# Patient Record
Sex: Male | Born: 1965 | Race: White | Hispanic: No | Marital: Married | Smoking: Current every day smoker
Health system: Southern US, Community
[De-identification: ages and names within clinical notes are randomized; demographics above are authoritative.]

## PROBLEM LIST (undated history)

## (undated) DIAGNOSIS — I2510 Atherosclerotic heart disease of native coronary artery without angina pectoris: Secondary | ICD-10-CM

## (undated) DIAGNOSIS — Z87891 Personal history of nicotine dependence: Secondary | ICD-10-CM

## (undated) HISTORY — DX: Atherosclerotic heart disease of native coronary artery without angina pectoris: I25.10

## (undated) HISTORY — PX: TOE SURGERY: SHX1073

## (undated) HISTORY — PX: HAND SURGERY: SHX662

## (undated) HISTORY — DX: Personal history of nicotine dependence: Z87.891

---

## 1999-06-08 ENCOUNTER — Encounter: Payer: Self-pay | Admitting: Specialist

## 1999-06-13 ENCOUNTER — Ambulatory Visit (HOSPITAL_COMMUNITY): Admission: RE | Admit: 1999-06-13 | Discharge: 1999-06-13 | Payer: Self-pay | Admitting: Specialist

## 2004-01-18 ENCOUNTER — Emergency Department (HOSPITAL_COMMUNITY): Admission: EM | Admit: 2004-01-18 | Discharge: 2004-01-18 | Payer: Self-pay | Admitting: Emergency Medicine

## 2007-05-25 ENCOUNTER — Ambulatory Visit (HOSPITAL_COMMUNITY): Payer: Self-pay | Admitting: Psychiatry

## 2007-06-17 ENCOUNTER — Ambulatory Visit (HOSPITAL_COMMUNITY): Payer: Self-pay | Admitting: Psychiatry

## 2007-08-27 ENCOUNTER — Ambulatory Visit (HOSPITAL_COMMUNITY): Payer: Self-pay | Admitting: Psychiatry

## 2007-11-18 ENCOUNTER — Ambulatory Visit (HOSPITAL_COMMUNITY): Payer: Self-pay | Admitting: Psychiatry

## 2008-01-29 ENCOUNTER — Ambulatory Visit (HOSPITAL_COMMUNITY): Payer: Self-pay | Admitting: Psychiatry

## 2008-04-19 ENCOUNTER — Ambulatory Visit (HOSPITAL_COMMUNITY): Payer: Self-pay | Admitting: Psychiatry

## 2008-07-19 ENCOUNTER — Ambulatory Visit (HOSPITAL_COMMUNITY): Payer: Self-pay | Admitting: Psychiatry

## 2008-08-31 ENCOUNTER — Ambulatory Visit (HOSPITAL_COMMUNITY): Payer: Self-pay | Admitting: Psychiatry

## 2008-10-25 ENCOUNTER — Ambulatory Visit (HOSPITAL_COMMUNITY): Payer: Self-pay | Admitting: Psychiatry

## 2009-02-10 ENCOUNTER — Ambulatory Visit (HOSPITAL_COMMUNITY): Payer: Self-pay | Admitting: Psychiatry

## 2009-05-05 ENCOUNTER — Ambulatory Visit (HOSPITAL_COMMUNITY): Payer: Self-pay | Admitting: Psychiatry

## 2009-06-28 ENCOUNTER — Ambulatory Visit (HOSPITAL_COMMUNITY): Payer: Self-pay | Admitting: Psychiatry

## 2016-10-10 ENCOUNTER — Other Ambulatory Visit: Payer: Self-pay

## 2016-10-10 ENCOUNTER — Encounter (HOSPITAL_COMMUNITY)
Admission: EM | Disposition: A | Discharge status: Home / Self Care | Payer: Self-pay | Source: Home / Self Care | Attending: Internal Medicine

## 2016-10-10 ENCOUNTER — Ambulatory Visit (HOSPITAL_COMMUNITY): Admit: 2016-10-10 | Payer: Self-pay | Admitting: Cardiology

## 2016-10-10 ENCOUNTER — Inpatient Hospital Stay (HOSPITAL_COMMUNITY): Payer: BLUE CROSS/BLUE SHIELD

## 2016-10-10 ENCOUNTER — Encounter (HOSPITAL_COMMUNITY): Payer: Self-pay | Admitting: Emergency Medicine

## 2016-10-10 ENCOUNTER — Inpatient Hospital Stay (HOSPITAL_COMMUNITY)
Admission: EM | Admit: 2016-10-10 | Discharge: 2016-10-13 | DRG: 247 | Disposition: A | Discharge status: Home / Self Care | Payer: BLUE CROSS/BLUE SHIELD | Attending: Internal Medicine | Admitting: Internal Medicine

## 2016-10-10 DIAGNOSIS — Z9861 Coronary angioplasty status: Secondary | ICD-10-CM

## 2016-10-10 DIAGNOSIS — I25119 Atherosclerotic heart disease of native coronary artery with unspecified angina pectoris: Secondary | ICD-10-CM | POA: Diagnosis present

## 2016-10-10 DIAGNOSIS — E785 Hyperlipidemia, unspecified: Secondary | ICD-10-CM | POA: Diagnosis not present

## 2016-10-10 DIAGNOSIS — I2510 Atherosclerotic heart disease of native coronary artery without angina pectoris: Secondary | ICD-10-CM

## 2016-10-10 DIAGNOSIS — D72829 Elevated white blood cell count, unspecified: Secondary | ICD-10-CM | POA: Diagnosis present

## 2016-10-10 DIAGNOSIS — N179 Acute kidney failure, unspecified: Secondary | ICD-10-CM | POA: Diagnosis present

## 2016-10-10 DIAGNOSIS — Z955 Presence of coronary angioplasty implant and graft: Secondary | ICD-10-CM

## 2016-10-10 DIAGNOSIS — I213 ST elevation (STEMI) myocardial infarction of unspecified site: Secondary | ICD-10-CM

## 2016-10-10 DIAGNOSIS — E7800 Pure hypercholesterolemia, unspecified: Secondary | ICD-10-CM | POA: Diagnosis present

## 2016-10-10 DIAGNOSIS — Z87891 Personal history of nicotine dependence: Secondary | ICD-10-CM

## 2016-10-10 DIAGNOSIS — I255 Ischemic cardiomyopathy: Secondary | ICD-10-CM | POA: Diagnosis present

## 2016-10-10 DIAGNOSIS — I2102 ST elevation (STEMI) myocardial infarction involving left anterior descending coronary artery: Principal | ICD-10-CM | POA: Diagnosis present

## 2016-10-10 DIAGNOSIS — R079 Chest pain, unspecified: Secondary | ICD-10-CM | POA: Diagnosis present

## 2016-10-10 DIAGNOSIS — F17210 Nicotine dependence, cigarettes, uncomplicated: Secondary | ICD-10-CM | POA: Diagnosis present

## 2016-10-10 HISTORY — PX: LEFT HEART CATH AND CORONARY ANGIOGRAPHY: CATH118249

## 2016-10-10 HISTORY — PX: CORONARY/GRAFT ACUTE MI REVASCULARIZATION: CATH118305

## 2016-10-10 LAB — COMPREHENSIVE METABOLIC PANEL
ALBUMIN: 4.3 g/dL (ref 3.5–5.0)
ALT: 28 U/L (ref 17–63)
ANION GAP: 12 (ref 5–15)
AST: 33 U/L (ref 15–41)
Alkaline Phosphatase: 87 U/L (ref 38–126)
BUN: 15 mg/dL (ref 6–20)
CALCIUM: 9.3 mg/dL (ref 8.9–10.3)
CO2: 27 mmol/L (ref 22–32)
Chloride: 98 mmol/L — ABNORMAL LOW (ref 101–111)
Creatinine, Ser: 1.34 mg/dL — ABNORMAL HIGH (ref 0.61–1.24)
GFR calc non Af Amer: 59 mL/min — ABNORMAL LOW (ref >60)
GLUCOSE: 141 mg/dL — AB (ref 65–99)
POTASSIUM: 3.6 mmol/L (ref 3.5–5.1)
SODIUM: 137 mmol/L (ref 135–145)
Total Bilirubin: 0.6 mg/dL (ref 0.3–1.2)
Total Protein: 7 g/dL (ref 6.5–8.1)

## 2016-10-10 LAB — DIFFERENTIAL
BASOS ABS: 0.1 10*3/uL (ref 0.0–0.1)
BASOS PCT: 1 %
Eosinophils Absolute: 0.4 10*3/uL (ref 0.0–0.7)
Eosinophils Relative: 3 %
LYMPHS PCT: 23 %
Lymphs Abs: 2.6 10*3/uL (ref 0.7–4.0)
Monocytes Absolute: 0.9 10*3/uL (ref 0.1–1.0)
Monocytes Relative: 8 %
NEUTROS ABS: 7.1 10*3/uL (ref 1.7–7.7)
NEUTROS PCT: 65 %

## 2016-10-10 LAB — PROTIME-INR
INR: 0.96
PROTHROMBIN TIME: 12.8 s (ref 11.4–15.2)

## 2016-10-10 LAB — APTT: APTT: 30 s (ref 24–36)

## 2016-10-10 LAB — TROPONIN I

## 2016-10-10 LAB — CBC
HCT: 46 % (ref 39.0–52.0)
Hemoglobin: 16.2 g/dL (ref 13.0–17.0)
MCH: 30.7 pg (ref 26.0–34.0)
MCHC: 35.2 g/dL (ref 30.0–36.0)
MCV: 87.1 fL (ref 78.0–100.0)
PLATELETS: 262 10*3/uL (ref 150–400)
RBC: 5.28 MIL/uL (ref 4.22–5.81)
RDW: 13.3 % (ref 11.5–15.5)
WBC: 10.9 10*3/uL — AB (ref 4.0–10.5)

## 2016-10-10 LAB — LIPID PANEL
CHOLESTEROL: 187 mg/dL (ref 0–200)
HDL: 37 mg/dL — AB (ref >40)
LDL Cholesterol: 143 mg/dL — ABNORMAL HIGH (ref 0–99)
Total CHOL/HDL Ratio: 5.1 RATIO
Triglycerides: 36 mg/dL (ref <150)
VLDL: 7 mg/dL (ref 0–40)

## 2016-10-10 LAB — BRAIN NATRIURETIC PEPTIDE: B Natriuretic Peptide: 17.8 pg/mL (ref 0.0–100.0)

## 2016-10-10 SURGERY — LEFT HEART CATH AND CORONARY ANGIOGRAPHY
Anesthesia: LOCAL

## 2016-10-10 MED ORDER — MIDAZOLAM HCL 2 MG/2ML IJ SOLN
INTRAMUSCULAR | Status: DC | PRN
  Administered 2016-10-10: 2 mg via INTRAVENOUS

## 2016-10-10 MED ORDER — TICAGRELOR 90 MG PO TABS
ORAL_TABLET | ORAL | Status: AC
  Filled 2016-10-10: qty 2

## 2016-10-10 MED ORDER — HEPARIN SODIUM (PORCINE) 1000 UNIT/ML IJ SOLN
INTRAMUSCULAR | Status: AC
Start: 2016-10-10 — End: 2016-10-10
  Filled 2016-10-10: qty 1

## 2016-10-10 MED ORDER — HEPARIN SODIUM (PORCINE) 1000 UNIT/ML IJ SOLN
INTRAMUSCULAR | Status: DC | PRN
  Administered 2016-10-10: 8000 [IU] via INTRAVENOUS

## 2016-10-10 MED ORDER — IOPAMIDOL (ISOVUE-370) INJECTION 76%
INTRAVENOUS | Status: AC
  Filled 2016-10-10: qty 100

## 2016-10-10 MED ORDER — HEPARIN (PORCINE) IN NACL 2-0.9 UNIT/ML-% IJ SOLN
INTRAMUSCULAR | Status: AC
  Filled 2016-10-10: qty 1000

## 2016-10-10 MED ORDER — NICOTINE 14 MG/24HR TD PT24
14.0000 mg | MEDICATED_PATCH | Freq: Every day | TRANSDERMAL | Status: DC
  Administered 2016-10-10 – 2016-10-13 (×4): 14 mg via TRANSDERMAL
  Filled 2016-10-10 (×4): qty 1

## 2016-10-10 MED ORDER — ACETAMINOPHEN 325 MG PO TABS
650.0000 mg | ORAL_TABLET | ORAL | Status: DC | PRN
  Administered 2016-10-11: 650 mg via ORAL
  Filled 2016-10-10: qty 2

## 2016-10-10 MED ORDER — ONDANSETRON HCL 4 MG/2ML IJ SOLN
4.0000 mg | Freq: Four times a day (QID) | INTRAMUSCULAR | Status: DC | PRN
  Administered 2016-10-11: 4 mg via INTRAVENOUS
  Filled 2016-10-10: qty 2

## 2016-10-10 MED ORDER — NITROGLYCERIN 0.4 MG SL SUBL: 0.4000 mg | SUBLINGUAL_TABLET | SUBLINGUAL | Status: DC | PRN

## 2016-10-10 MED ORDER — ASPIRIN EC 81 MG PO TBEC
81.0000 mg | DELAYED_RELEASE_TABLET | Freq: Every day | ORAL | Status: DC
  Administered 2016-10-11 – 2016-10-13 (×3): 81 mg via ORAL
  Filled 2016-10-10 (×2): qty 1

## 2016-10-10 MED ORDER — NITROGLYCERIN 1 MG/10 ML FOR IR/CATH LAB
INTRA_ARTERIAL | Status: DC | PRN
  Administered 2016-10-10: 200 ug via INTRACORONARY

## 2016-10-10 MED ORDER — SODIUM CHLORIDE 0.9 % WEIGHT BASED INFUSION: 1.0000 mL/kg/h | INTRAVENOUS | Status: AC

## 2016-10-10 MED ORDER — TICAGRELOR 90 MG PO TABS
90.0000 mg | ORAL_TABLET | Freq: Two times a day (BID) | ORAL | Status: DC
  Administered 2016-10-11 – 2016-10-13 (×5): 90 mg via ORAL
  Filled 2016-10-10 (×6): qty 1

## 2016-10-10 MED ORDER — IOPAMIDOL (ISOVUE-370) INJECTION 76%
INTRAVENOUS | Status: DC | PRN
  Administered 2016-10-10: 130 mL via INTRA_ARTERIAL

## 2016-10-10 MED ORDER — HEPARIN SODIUM (PORCINE) 5000 UNIT/ML IJ SOLN: 5000.0000 [IU] | Freq: Three times a day (TID) | INTRAMUSCULAR | Status: DC

## 2016-10-10 MED ORDER — ATORVASTATIN CALCIUM 80 MG PO TABS
80.0000 mg | ORAL_TABLET | Freq: Every day | ORAL | Status: DC
  Administered 2016-10-11 – 2016-10-12 (×2): 80 mg via ORAL
  Filled 2016-10-10 (×2): qty 1

## 2016-10-10 MED ORDER — SODIUM CHLORIDE 0.9% FLUSH: 3.0000 mL | INTRAVENOUS | Status: DC | PRN

## 2016-10-10 MED ORDER — METOPROLOL TARTRATE 50 MG PO TABS
50.0000 mg | ORAL_TABLET | Freq: Two times a day (BID) | ORAL | Status: DC
  Administered 2016-10-11 – 2016-10-13 (×4): 50 mg via ORAL
  Filled 2016-10-10 (×5): qty 1

## 2016-10-10 MED ORDER — LIDOCAINE HCL (PF) 1 % IJ SOLN
INTRAMUSCULAR | Status: AC
  Filled 2016-10-10: qty 30

## 2016-10-10 MED ORDER — HEPARIN SODIUM (PORCINE) 5000 UNIT/ML IJ SOLN
4000.0000 [IU] | INTRAMUSCULAR | Status: DC
  Filled 2016-10-10: qty 1

## 2016-10-10 MED ORDER — VERAPAMIL HCL 2.5 MG/ML IV SOLN
INTRAVENOUS | Status: DC | PRN
  Administered 2016-10-10: 10 mL via INTRA_ARTERIAL

## 2016-10-10 MED ORDER — SODIUM CHLORIDE 0.9% FLUSH
3.0000 mL | Freq: Two times a day (BID) | INTRAVENOUS | Status: DC
  Administered 2016-10-11 – 2016-10-12 (×3): 3 mL via INTRAVENOUS

## 2016-10-10 MED ORDER — MIDAZOLAM HCL 2 MG/2ML IJ SOLN
INTRAMUSCULAR | Status: AC
  Filled 2016-10-10: qty 2

## 2016-10-10 MED ORDER — ASPIRIN 81 MG PO CHEW
81.0000 mg | CHEWABLE_TABLET | Freq: Every day | ORAL | Status: DC
  Administered 2016-10-11 – 2016-10-13 (×3): 81 mg via ORAL
  Filled 2016-10-10 (×2): qty 1

## 2016-10-10 MED ORDER — LIDOCAINE HCL (PF) 1 % IJ SOLN
INTRAMUSCULAR | Status: DC | PRN
  Administered 2016-10-10: 2 mL

## 2016-10-10 MED ORDER — SODIUM CHLORIDE 0.9 % IV SOLN: 250.0000 mL | INTRAVENOUS | Status: DC | PRN

## 2016-10-10 MED ORDER — SODIUM CHLORIDE 0.9 % IV SOLN: 10.0000 mL/h | INTRAVENOUS | Status: DC

## 2016-10-10 MED ORDER — NITROGLYCERIN 1 MG/10 ML FOR IR/CATH LAB
INTRA_ARTERIAL | Status: AC
  Filled 2016-10-10: qty 10

## 2016-10-10 MED ORDER — HEPARIN SODIUM (PORCINE) 5000 UNIT/ML IJ SOLN
5000.0000 [IU] | Freq: Three times a day (TID) | INTRAMUSCULAR | Status: DC
  Administered 2016-10-11 – 2016-10-12 (×6): 5000 [IU] via SUBCUTANEOUS
  Filled 2016-10-10 (×7): qty 1

## 2016-10-10 MED ORDER — TICAGRELOR 90 MG PO TABS
ORAL_TABLET | ORAL | Status: DC | PRN
  Administered 2016-10-10: 180 mg via ORAL

## 2016-10-10 SURGICAL SUPPLY — 18 items
BALLN EUPHORA RX 2.5X12 (BALLOONS) ×2
BALLN ~~LOC~~ MOZEC 4.0X15 (BALLOONS) ×2
BALLOON EUPHORA RX 2.5X12 (BALLOONS) ×1 IMPLANT
BALLOON ~~LOC~~ MOZEC 4.0X15 (BALLOONS) ×1 IMPLANT
CATH INFINITI 5FR MULTPACK ANG (CATHETERS) ×2 IMPLANT
CATH VISTA GUIDE 6FR XBLAD3.5 (CATHETERS) ×2 IMPLANT
DEVICE RAD COMP TR BAND LRG (VASCULAR PRODUCTS) ×2 IMPLANT
GLIDESHEATH SLEND SS 6F .021 (SHEATH) ×2 IMPLANT
GUIDEWIRE INQWIRE 1.5J.035X260 (WIRE) ×1 IMPLANT
INQWIRE 1.5J .035X260CM (WIRE) ×2
KIT ENCORE 26 ADVANTAGE (KITS) ×2 IMPLANT
KIT HEART LEFT (KITS) ×2 IMPLANT
PACK CARDIAC CATHETERIZATION (CUSTOM PROCEDURE TRAY) ×2 IMPLANT
STENT PROMUS PREM MR 3.5X20 (Permanent Stent) ×2 IMPLANT
SYR MEDRAD MARK V 150ML (SYRINGE) ×2 IMPLANT
TRANSDUCER W/STOPCOCK (MISCELLANEOUS) ×2 IMPLANT
TUBING CIL FLEX 10 FLL-RA (TUBING) ×2 IMPLANT
WIRE ASAHI PROWATER 180CM (WIRE) ×2 IMPLANT

## 2016-10-10 NOTE — ED Notes (Signed)
Pt was brought in EMS and pt was taken straight to cath lab

## 2016-10-10 NOTE — ED Provider Notes (Signed)
[facility]-EMERGENCY DEPT Provider Note   CSN: [id number] Arrival date & time: 10/10/16  2116     History   Chief Complaint Chief Complaint  Patient presents with  . Code STEMI    HPI DALYN BECKER is a 51 y.o. male.  HPI  51 year old man history of pericarditis 8 years ago and 15 years ago who presents today with onset of substernal chest pain that began 45 minutes prior to evaluation. He describes it as severe pressure in nature. It is radiating to both arms. He was brought in by EMS who activated code STEMI in the field. He received prehospital aspirin but refused nitroglycerin. He was given 6 mg of morphine. He states his pain is currently 4 but increases to 5 on talking him.  He has no other significant past medical history but states he has very strong family history of coronary artery disease and is a heavy smoker. He denies any illicit drug use.  Past Medical History:  Diagnosis Date  . Acute pericarditis   . Chest pain   . History of tobacco abuse     There are no active problems to display for this patient.   Past Surgical History:  Procedure Laterality Date  . HAND SURGERY    . TOE SURGERY     left great toe       Home Medications    Prior to Admission medications   Medication Sig Start Date End Date Taking? Authorizing Provider  Naproxen Sodium (ALEVE PO) Take by mouth 2 (two) times daily.    Historical Provider, MD    Family History Family History  Problem Relation Age of Onset  . Hypertension Mother   . Heart disease Father     Social History Social History  Substance Use Topics  . Smoking status: Current Every Day Smoker  . Smokeless tobacco: Not on file  . Alcohol use Not on file     Allergies   Sulfa antibiotics   Review of Systems Review of Systems  All other systems reviewed and are negative.    Physical Exam Updated Vital Signs BP (!) 158/120 (BP Location: Left Arm)   Temp 98.7 F (37.1 C) (Oral)   Resp 14   Ht 5\' 8"   (1.727 m)   Wt 84.8 kg   SpO2 100%   BMI 28.43 kg/m   Physical Exam  Constitutional: He is oriented to person, place, and time. He appears well-developed and well-nourished.  HENT:  Head: Normocephalic and atraumatic.  Right Ear: External ear normal.  Left Ear: External ear normal.  Nose: Nose normal.  Mouth/Throat: Oropharynx is clear and moist.  Eyes: Conjunctivae and EOM are normal. Pupils are equal, round, and reactive to light.  Neck: Normal range of motion.  Cardiovascular: Normal rate, regular rhythm, normal heart sounds and intact distal pulses.  Exam reveals no gallop.   No murmur heard. Pulmonary/Chest: Effort normal and breath sounds normal. No respiratory distress.  Abdominal: Soft. Bowel sounds are normal.  Musculoskeletal: Normal range of motion. He exhibits no edema or deformity.  Neurological: He is alert and oriented to person, place, and time.  Skin: Skin is warm. Capillary refill takes less than 2 seconds. He is not diaphoretic.  Psychiatric: He has a normal mood and affect. His behavior is normal. Judgment and thought content normal.  Nursing note and vitals reviewed.    ED Treatments / Results  Labs (all labs ordered are listed, but only abnormal results are displayed) Labs Reviewed  CBC  DIFFERENTIAL  PROTIME-INR  APTT  COMPREHENSIVE METABOLIC PANEL  TROPONIN I  LIPID PANEL    EKG  EKG Interpretation  Date/Time:  Thursday October 10 2016 21:19:32 EDT Ventricular Rate:  94 PR Interval:    QRS Duration: 89 QT Interval:  346 QTC Calculation: 433 R Axis:   58 Text Interpretation:  Sinus rhythm st elevation v3,v4, I and aVL Confirmed by Camie Hauss MD, Kisean Rollo ([id number]) on 10/10/2016 9:27:51 PM       Radiology No results found.  Procedures Procedures (including critical care time)  Medications Ordered in ED Medications  0.9 %  sodium chloride infusion (not administered)  heparin injection 4,000 Units (not administered)     Initial Impression /  Assessment and Plan / ED Course  I have reviewed the triage vital signs and the nursing notes.  Pertinent labs & imaging results that were available during my care of the patient were reviewed by me and considered in my medical decision making (see chart for details).     Cardiology at bedside and patient to proceed to cath lab.  Final Clinical Impressions(s) / ED Diagnoses   Final diagnoses:  Acute ST elevation myocardial infarction (STEMI), unspecified artery [facility] [place](HCC)    New Prescriptions New Prescriptions   No medications on file     Margarita Grizzleanielle Amias Hutchinson, MD 10/10/16 2128

## 2016-10-10 NOTE — ED Triage Notes (Signed)
Per ems pt was having chest pain at home and thought indigestion. Pt refused nitro, but was give 324 morphine, and 6mg  morphine 170/106

## 2016-10-10 NOTE — H&P (Signed)
CARDIOLOGY HISTORY AND PHYSICAL     Date: 10/10/2016 Admitting Physician: Peter M [place], MD  Chief Complaint:  Chest pain ____________________________________________________________________ History of Present Illness:  Travis Levine is a 51 y.o. old male with medical history noted below who presents via EMS with acute complaints of chest pain.  The patient states this pain started suddenly approximately 1.5 hrs prior to calling EMS.  He initially felt this was due to indigestion and went to a local [facility].  As the patient worsened, he went home a called EMS.  Currently rates the pain as a 4/10, with associated diaphoresis.  No nausea or vomiting.  Does have pain radiating down both arms.  Denies syncope.  No relief of pain with leaning forward.  Review of Systems:   Review of Systems:  GEN: no fever, chills, nausea, vomiting, weight change  HEENT: no vision or hearing changes  PULM: no coughing, SOB  CV: no +chest pain, palpitations, PND, orthopnea  GI: no abdominal pain  GU: no dysuria  EXT: no swelling  SKIN: no rashes  NEURO: no numbness or tingling  HEME: no bleeding or bruising  GYN: none  --12 point review systems- otherwise negative.  All other systems reviewed and are negative  Past Medical History:  Diagnosis Date  . Acute pericarditis   . Chest pain   . History of tobacco abuse     Past Surgical History:  Procedure Laterality Date  . HAND SURGERY    . TOE SURGERY     left great toe    Social History   Social History  . Marital status: Married    Spouse name: N/A  . Number of children: N/A  . Years of education: N/A   Occupational History  . Not on file.   Social History Main Topics  . Smoking status: Current Every Day Smoker  . Smokeless tobacco: Not on file  . Alcohol use Not on file  . Drug use: Unknown  . Sexual activity: Not on file   Other Topics Concern  . Not on file   Social History Narrative  . No narrative on file     Family History  Problem Relation Age of Onset  . Hypertension Mother   . Heart disease Father     Past Cardiovascular History:  - No documented h/o CAD - No documented h/o MI - No documented h/o CHF - No documented h/o PVD - No documented h/o AAA - No documented h/o valvular heart disease - No documented h/o CVA - No documented h/o Arrhythmias - No documented h/o A-fib  - No documented h/o congenital heart disease - No documented h/o CABG - No documented h/o PCI - No documented h/o cardiac devices (Pacer/ICD/CRT) - No documented h/o cardiac surgery       Most recent stress test:  None  Most recent echocardiography:  None  Most recent left heart catheterization:  None  CABG:  Date/ Physician: None  Device history:  None  Prior to Admission medications   Medication Sig Start Date End Date Taking? Authorizing Provider  Naproxen Sodium (ALEVE PO) Take by mouth 2 (two) times daily.    Historical Provider, MD    Allergies  Allergen Reactions  . Sulfa Antibiotics Rash    Social History:   Social History  Substance Use Topics  . Smoking status: Current Every Day Smoker  . Smokeless tobacco: Not on file  . Alcohol use Not on file    Family History  Problem  Relation Age of Onset  . Hypertension Mother   . Heart disease Father     Physical Examination: Blood pressure (!) 158/120, temperature 98.7 F (37.1 C), temperature source Oral, resp. rate 14, height 5\' 8"  (1.727 m), weight 84.8 kg (187 lb), SpO2 100 %. General:  AAOX 4.  NAD.  NRD. Diaphoretic HENT: Normocephalic. Atraumatic.  No acute abnom. EYES: PERRL EOMI  Neck: Supple.  No JVD.  No bruits. Cardiovascular:  Nl S1. Nl S2. No S3. No S4. Nl PMI. No m/r/c. RRR  Pulmonary/Chest: CTA B. No rales. No wheezing.  Abdomen: Soft, NT, no masses, no organomegaly. Neuro: CN intact, no motor/sensory deficit.  Ext: Warm. No edema.  SKIN- intact  No intake or output data in the 24 hours ending 10/10/16  2131  Troponin (Point of Care Test) No results for input(s): TROPIPOC in the last 72 hours. ____________________________________________________________________ Assessment/Plan  STEMI   Assessment:  Patient with ECG concerning for anterior STEMI.  The ECG here in the ED looks somewhat better compared to the early transmission.  We'll take emergently to the cath lab.   Plan  -  Cath  -  Further plan following results from the cath  Tobacco abuse   Assessment:  Patient with his account "extensive" smoking history.   Plan  -  Smoking cessation counseling  -  Nicotine patch  Thank you for consulting cardiology.    Electronically signed by Nada MaclachlanGregory S Chene Kasinger 10/10/2016 Link SnufferGregory Liane Tribbey, MD, PhD Cardiology

## 2016-10-11 ENCOUNTER — Inpatient Hospital Stay (HOSPITAL_COMMUNITY): Payer: BLUE CROSS/BLUE SHIELD

## 2016-10-11 ENCOUNTER — Encounter (HOSPITAL_COMMUNITY): Payer: Self-pay | Admitting: Certified Registered Nurse Anesthetist

## 2016-10-11 DIAGNOSIS — E7800 Pure hypercholesterolemia, unspecified: Secondary | ICD-10-CM

## 2016-10-11 DIAGNOSIS — Z87891 Personal history of nicotine dependence: Secondary | ICD-10-CM

## 2016-10-11 DIAGNOSIS — I2510 Atherosclerotic heart disease of native coronary artery without angina pectoris: Secondary | ICD-10-CM

## 2016-10-11 LAB — CBC
HCT: 41.9 % (ref 39.0–52.0)
Hemoglobin: 14.6 g/dL (ref 13.0–17.0)
MCH: 30.2 pg (ref 26.0–34.0)
MCHC: 34.8 g/dL (ref 30.0–36.0)
MCV: 86.6 fL (ref 78.0–100.0)
PLATELETS: 211 10*3/uL (ref 150–400)
RBC: 4.84 MIL/uL (ref 4.22–5.81)
RDW: 13.4 % (ref 11.5–15.5)
WBC: 9.6 10*3/uL (ref 4.0–10.5)

## 2016-10-11 LAB — BASIC METABOLIC PANEL
Anion gap: 8 (ref 5–15)
BUN: 13 mg/dL (ref 6–20)
CALCIUM: 8.5 mg/dL — AB (ref 8.9–10.3)
CO2: 26 mmol/L (ref 22–32)
Chloride: 103 mmol/L (ref 101–111)
Creatinine, Ser: 1.05 mg/dL (ref 0.61–1.24)
GFR calc Af Amer: >60 mL/min (ref >60)
GFR calc non Af Amer: >60 mL/min (ref >60)
GLUCOSE: 94 mg/dL (ref 65–99)
POTASSIUM: 3.7 mmol/L (ref 3.5–5.1)
Sodium: 137 mmol/L (ref 135–145)

## 2016-10-11 LAB — ECHOCARDIOGRAM COMPLETE
HEIGHTINCHES: 68 in
WEIGHTICAEL: 3114.66 [oz_av]

## 2016-10-11 LAB — TROPONIN I
Troponin I: 23.58 ng/mL (ref <0.03)
Troponin I: 32.89 ng/mL (ref <0.03)

## 2016-10-11 LAB — POCT ACTIVATED CLOTTING TIME: Activated Clotting Time: 340 seconds

## 2016-10-11 LAB — LIPID PANEL
CHOL/HDL RATIO: 5.7 ratio
Cholesterol: 187 mg/dL (ref 0–200)
HDL: 33 mg/dL — AB (ref >40)
LDL CALC: 124 mg/dL — AB (ref 0–99)
Triglycerides: 152 mg/dL — ABNORMAL HIGH (ref <150)
VLDL: 30 mg/dL (ref 0–40)

## 2016-10-11 LAB — MRSA PCR SCREENING: MRSA BY PCR: NEGATIVE

## 2016-10-11 LAB — HIV ANTIBODY (ROUTINE TESTING W REFLEX): HIV Screen 4th Generation wRfx: NONREACTIVE

## 2016-10-11 MED ORDER — ZOLPIDEM TARTRATE 5 MG PO TABS
10.0000 mg | ORAL_TABLET | Freq: Every evening | ORAL | Status: DC | PRN
  Administered 2016-10-11 – 2016-10-12 (×2): 10 mg via ORAL
  Filled 2016-10-11 (×2): qty 2

## 2016-10-11 MED ORDER — PERFLUTREN LIPID MICROSPHERE
1.0000 mL | INTRAVENOUS | Status: AC | PRN
  Administered 2016-10-11: 3 mL via INTRAVENOUS
  Filled 2016-10-11: qty 10

## 2016-10-11 MED FILL — Heparin Sodium (Porcine) 2 Unit/ML in Sodium Chloride 0.9%: INTRAMUSCULAR | Qty: 500 | Status: AC

## 2016-10-11 NOTE — Progress Notes (Signed)
Progress Note  Patient Name: Travis Levine Date of Encounter: 10/11/2016  Primary Cardiologist: New- [place] MD  Subjective   Has persistent low grade sternal pain- constant. Markedly improved from last night. No dyspnea or palpitations.  Inpatient Medications    Scheduled Meds: . aspirin  81 mg Oral Daily  . aspirin EC  81 mg Oral Daily  . atorvastatin  80 mg Oral q1800  . heparin  5,000 Units Subcutaneous Q8H  . metoprolol tartrate  50 mg Oral BID  . nicotine  14 mg Transdermal Daily  . sodium chloride flush  3 mL Intravenous Q12H  . ticagrelor  90 mg Oral BID   Continuous Infusions: . sodium chloride 10 mL/hr (10/11/16 0900)   PRN Meds: sodium chloride, acetaminophen, nitroGLYCERIN, ondansetron (ZOFRAN) IV, sodium chloride flush   Vital Signs    Vitals:   10/11/16 1200 10/11/16 1300 10/11/16 1400 10/11/16 1500  BP: (!) 121/91 121/83 120/88 126/90  Pulse: 73 66 70 68  Resp: Temp:      TempSrc:      SpO2: 97% 93% 94% 95%  Weight:      Height:        Intake/Output Summary (Last 24 hours) at 10/11/16 1621 Last data filed at 10/11/16 0800  Gross per 24 hour  Intake           768.85 ml  Output              475 ml  Net           293.85 ml   Filed Weights   10/10/16 2121 10/10/16 2239  Weight: 187 lb (84.8 kg) 194 lb 10.7 oz (88.3 kg)    Telemetry    NSR, run of AIVR - Personally Reviewed  ECG    NSR, normal. ST elevation resolved. - Personally Reviewed  Physical Exam   GEN: No acute distress.   Neck: No JVD Cardiac: RRR, no murmurs, rubs, or gallops.  Respiratory: Clear to auscultation bilaterally. GI: Soft, nontender, non-distended  MS: No edema; No deformity. Right radial site without hematoma. Neuro:  Nonfocal  Psych: Normal affect   Labs    Chemistry Recent Labs Lab 10/10/16 2123 10/11/16 0339  NA 137 137  K 3.6 3.7  CL 98* 103  CO2 27 26  GLUCOSE 141* 94  BUN 15 13  CREATININE 1.34* 1.05  CALCIUM 9.3 8.5*    PROT 7.0  --   ALBUMIN 4.3  --   AST 33  --   ALT 28  --   ALKPHOS 87  --   BILITOT 0.6  --   GFRNONAA 59* >60  GFRAA >60 >60  ANIONGAP 12 8     Hematology Recent Labs Lab 10/10/16 2123 10/11/16 0339  WBC 10.9* 9.6  RBC 5.28 4.84  HGB 16.2 14.6  HCT 46.0 41.9  MCV 87.1 86.6  MCH 30.7 30.2  MCHC 35.2 34.8  RDW 13.3 13.4  PLT 262 211    Cardiac Enzymes Recent Labs Lab 10/10/16 2123 10/11/16 0339 10/11/16 0918  TROPONINI <0.03 23.58* 32.89*   No results for input(s): TROPIPOC in the last 168 hours.   BNP Recent Labs Lab 10/10/16 2248  BNP 17.8     DDimer No results for input(s): DDIMER in the last 168 hours.   Radiology    Dg Chest Port 1 View  Result Date: 10/10/2016 CLINICAL DATA:  ST-elevation myocardial infarction, acute onset. Initial encounter. EXAM: PORTABLE CHEST  1 VIEW COMPARISON:  None. FINDINGS: The lungs are well-aerated and clear. There is no evidence of focal opacification, pleural effusion or pneumothorax. The cardiomediastinal silhouette is within normal limits. No acute osseous abnormalities are seen. IMPRESSION: No acute cardiopulmonary process seen. Electronically Signed   By: Roanna Raider M.D.   On: 10/10/2016 23:00    Cardiac Studies   Procedures   Coronary/Graft Acute MI Revascularization  Left Heart Cath and Coronary Angiography  Conclusion     There is mild left ventricular systolic dysfunction.  The left ventricular ejection fraction is 45-50% by visual estimate.  LV end diastolic pressure is normal.  A STENT PROMUS PREM MR 3.5X20 drug eluting stent was successfully placed.  Prox LAD to Mid LAD lesion, 100 %stenosed.  Post intervention, there is a 0% residual stenosis.   1. 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 Mild LV dysfunction 3. Normal LVEDP 4. Successful stenting of the LAD with DES  Plan: DAPT for one year. Smoking cessation. Statin therapy. Beta blocker.     Study  Conclusions  - Left ventricle: The cavity size was normal. There was mild   concentric hypertrophy. Systolic function was mildly reduced. The   estimated ejection fraction was in the range of 45% to 50%. Wall   motion was normal; there were no regional wall motion   abnormalities. Doppler parameters are consistent with abnormal   left ventricular relaxation (grade 1 diastolic dysfunction).   Doppler parameters are consistent with elevated ventricular   end-diastolic filling pressure. - Aortic valve: Trileaflet; normal thickness leaflets. There was no   regurgitation. - Aortic root: The aortic root was normal in size. - Mitral valve: Structurally normal valve. - Right ventricle: Systolic function was normal. - Tricuspid valve: There was trivial regurgitation. - Pulmonary arteries: Systolic pressure was within the normal   range. - Inferior vena cava: The vessel was normal in size. - Pericardium, extracardiac: There was no pericardial effusion.  Impressions:  - There is akinesis of the mid anteroseptal, apical septal,   anterior walls and of the true apex. LVEF 45-50%. No thrombus in   the LV apex on Definity images.  Patient Profile     51 y.o. male with history of tobacco abuse presented with an anterior STEMI  Assessment & Plan   1. Anterior STEMI. Peak troponin 32.89. Ecg normalized. s/p DES of proximal LAD. Angina improved. Overall mild LV dysfunction. Plan DAPT for one year. Statin and beta blocker therapy. Anticipate transfer to telemetry in am. Cardiac Rehab. Discussed lifestyle modifications. Some reperfusion arrhythmia noted but improving.   2. Tobacco abuse. Counseled on smoking cessation and he is motivated with quit.   3. Hypercholesterolemia. On high dose statin   Signed, Damante Spragg [place], MD  10/11/2016, 4:21 PM

## 2016-10-11 NOTE — Progress Notes (Signed)
Deflated 3 cc from TR band per protocol. Site began to ooze, reinflated 3 cc, 10 cc total in band. Will reattempt in 15 minutes.  Herma Ard, RN

## 2016-10-11 NOTE — Progress Notes (Signed)
CRITICAL VALUE ALERT  Critical value received:  Troponin 23.58  Date of notification:  10/11/16  Time of notification:  4:46 AM  Critical value read back:Yes.    Nurse who received alert:  Dianna Rossetti, RN  MD notified (1st page):  Tiburcio Pea, MD  Time of first page:  [phone]  MD notified (2nd page):  Time of second page:  Responding MD:  Tiburcio Pea, MD  Time MD responded:  [phone], no new orders received at this time.

## 2016-10-11 NOTE — Progress Notes (Signed)
  Echocardiogram 2D Echocardiogram has been performed.  Arvil Chaco 10/11/2016, 11:22 AM

## 2016-10-11 NOTE — Progress Notes (Signed)
[id number] MI education completed with pt who voiced understanding. Stressed importance of brilinta with stent. Needs to see case manager for brilinta card. Reviewed NTG use, risk factors, ex ed and heart healthy diet. Pt with several pertinent questions. Gave stent card. Discussed smoking cessation and gave handout. Did not want fake cigarette. Pt quit once before with nicotine patches. Discussed CRP 2 and will refer to [place]. Discussed MI restrictions. Pt had visitor and wanted to wait to get up until she left. Encouraged up in chair and then walk later with RN. We will follow up tomorrow. Luetta Nutting RN BSN 10/11/2016 12:23 PM

## 2016-10-11 NOTE — Progress Notes (Signed)
TR band removed @ 0251 per protocol. No bleeding noted to site. Placed gauze and tegaderm dressing over site to form pressure dressing. Educated pt to notify immediately for any signs of bleeding. Will continue to closely monitor.  Herma Ard, RN

## 2016-10-12 DIAGNOSIS — E785 Hyperlipidemia, unspecified: Secondary | ICD-10-CM | POA: Diagnosis present

## 2016-10-12 LAB — BASIC METABOLIC PANEL
ANION GAP: 8 (ref 5–15)
BUN: 16 mg/dL (ref 6–20)
CALCIUM: 8.7 mg/dL — AB (ref 8.9–10.3)
CO2: 25 mmol/L (ref 22–32)
Chloride: 102 mmol/L (ref 101–111)
Creatinine, Ser: 1.27 mg/dL — ABNORMAL HIGH (ref 0.61–1.24)
Glucose, Bld: 96 mg/dL (ref 65–99)
Potassium: 4 mmol/L (ref 3.5–5.1)
Sodium: 135 mmol/L (ref 135–145)

## 2016-10-12 LAB — CBC
HCT: 45.8 % (ref 39.0–52.0)
HEMOGLOBIN: 15.6 g/dL (ref 13.0–17.0)
MCH: 30.1 pg (ref 26.0–34.0)
MCHC: 34.1 g/dL (ref 30.0–36.0)
MCV: 88.4 fL (ref 78.0–100.0)
Platelets: 218 10*3/uL (ref 150–400)
RBC: 5.18 MIL/uL (ref 4.22–5.81)
RDW: 13.6 % (ref 11.5–15.5)
WBC: 11 10*3/uL — ABNORMAL HIGH (ref 4.0–10.5)

## 2016-10-12 LAB — HEMOGLOBIN A1C
Hgb A1c MFr Bld: 5.6 % (ref 4.8–5.6)
Mean Plasma Glucose: 114 mg/dL

## 2016-10-12 MED ORDER — SODIUM CHLORIDE 0.9 % IV BOLUS (SEPSIS)
500.0000 mL | Freq: Once | INTRAVENOUS | Status: AC
  Administered 2016-10-12: 500 mL via INTRAVENOUS

## 2016-10-12 NOTE — Progress Notes (Signed)
CARDIAC REHAB PHASE I   PRE:  Rate/Rhythm: 70  BP:  Sitting: 114/86     SaO2: 98ra  MODE:  Ambulation: 750 ft   POST:  Rate/Rhythm: 76  BP:  Sitting: 133/94     SaO2: 94%ra  10:54am- Patient walked independently with no complaints. Re-visited lifestyle modification. Encouraged Rehab.   Barnabas Lister Shauntae Reitman, MS 10/12/2016 11:25 AM

## 2016-10-12 NOTE — Progress Notes (Signed)
DAILY PROGRESS NOTE  Subjective:  No events overnight. Mild leukocytosis, no fever. Creatinine is up. Troponin peaked at 32.89. Echo shows EF 45-50% with anteroseptal akinesis. LDL-C on admission was 143.  Objective:  Temp:  [98.2 F (36.8 C)-98.4 F (36.9 C)] 98.2 F (36.8 C) (03/30 1950) Pulse Rate:  [56-93] 67 (03/31 0700) Resp:  [11-24] 16 (03/31 0700) BP: (89-126)/(65-95) 111/83 (03/31 0700) SpO2:  [90 %-99 %] 94 % (03/31 0700) Weight change:   Intake/Output from previous day: 03/30 0701 - 03/31 0700 In: 249.6 [I.V.:249.6] Out: 850 [Urine:850]  Intake/Output from this shift: No intake/output data recorded.  Medications: No current facility-administered medications on file prior to encounter.    No current outpatient prescriptions on file prior to encounter.    Physical Exam: General appearance: alert and no distress Neck: no carotid bruit and no JVD Lungs: clear to auscultation bilaterally Heart: regular rate and rhythm Abdomen: soft, non-tender; bowel sounds normal; no masses,  no organomegaly Extremities: extremities normal, atraumatic, no cyanosis or edema Pulses: 2+ and symmetric Skin: Skin color, texture, turgor normal. No rashes or lesions Neurologic: Grossly normal Psych: Pleasant  Lab Results: Results for orders placed or performed during the hospital encounter of 10/10/16 (from the past 48 hour(s))  CBC     Status: Abnormal   Collection Time: 10/10/16  9:23 PM  Result Value Ref Range   WBC 10.9 (H) 4.0 - 10.5 K/uL   RBC 5.28 4.22 - 5.81 MIL/uL   Hemoglobin 16.2 13.0 - 17.0 g/dL   HCT 46.0 39.0 - 52.0 %   MCV 87.1 78.0 - 100.0 fL   MCH 30.7 26.0 - 34.0 pg   MCHC 35.2 30.0 - 36.0 g/dL   RDW 13.3 11.5 - 15.5 %   Platelets 262 150 - 400 K/uL  Differential     Status: None   Collection Time: 10/10/16  9:23 PM  Result Value Ref Range   Neutrophils Relative % 65 %   Neutro Abs 7.1 1.7 - 7.7 K/uL   Lymphocytes Relative 23 %   Lymphs Abs 2.6  0.7 - 4.0 K/uL   Monocytes Relative 8 %   Monocytes Absolute 0.9 0.1 - 1.0 K/uL   Eosinophils Relative 3 %   Eosinophils Absolute 0.4 0.0 - 0.7 K/uL   Basophils Relative 1 %   Basophils Absolute 0.1 0.0 - 0.1 K/uL  Protime-INR     Status: None   Collection Time: 10/10/16  9:23 PM  Result Value Ref Range   Prothrombin Time 12.8 11.4 - 15.2 seconds   INR 0.96   APTT     Status: None   Collection Time: 10/10/16  9:23 PM  Result Value Ref Range   aPTT 30 24 - 36 seconds  Comprehensive metabolic panel     Status: Abnormal   Collection Time: 10/10/16  9:23 PM  Result Value Ref Range   Sodium 137 135 - 145 mmol/L   Potassium 3.6 3.5 - 5.1 mmol/L   Chloride 98 (L) 101 - 111 mmol/L   CO2 27 22 - 32 mmol/L   Glucose, Bld 141 (H) 65 - 99 mg/dL   BUN 15 6 - 20 mg/dL   Creatinine, Ser 1.34 (H) 0.61 - 1.24 mg/dL   Calcium 9.3 8.9 - 10.3 mg/dL   Total Protein 7.0 6.5 - 8.1 g/dL   Albumin 4.3 3.5 - 5.0 g/dL   AST 33 15 - 41 U/L   ALT 28 17 - 63 U/L   Alkaline  Phosphatase 87 38 - 126 U/L   Total Bilirubin 0.6 0.3 - 1.2 mg/dL   GFR calc non Af Amer 59 (L) >60 mL/min   GFR calc Af Amer >60 >60 mL/min    Comment: (NOTE) The eGFR has been calculated using the CKD EPI equation. This calculation has not been validated in all clinical situations. eGFR's persistently <60 mL/min signify possible Chronic Kidney Disease.    Anion gap 12 5 - 15  Troponin I     Status: None   Collection Time: 10/10/16  9:23 PM  Result Value Ref Range   Troponin I <0.03 <0.03 ng/mL  POCT Activated clotting time     Status: None   Collection Time: 10/10/16  9:51 PM  Result Value Ref Range   Activated Clotting Time 340 seconds  Lipid panel     Status: Abnormal   Collection Time: 10/10/16 10:48 PM  Result Value Ref Range   Cholesterol 187 0 - 200 mg/dL   Triglycerides 36 <150 mg/dL   HDL 37 (L) >40 mg/dL   Total CHOL/HDL Ratio 5.1 RATIO   VLDL 7 0 - 40 mg/dL   LDL Cholesterol 143 (H) 0 - 99 mg/dL    Comment:         Total Cholesterol/HDL:CHD Risk Coronary Heart Disease Risk Table                     Men   Women  1/2 Average Risk   3.4   3.3  Average Risk       5.0   4.4  2 X Average Risk   9.6   7.1  3 X Average Risk  23.4   11.0        Use the calculated Patient Ratio above and the CHD Risk Table to determine the patient's CHD Risk.        ATP III CLASSIFICATION (LDL):  <100     mg/dL   Optimal  100-129  mg/dL   Near or Above                    Optimal  130-159  mg/dL   Borderline  160-189  mg/dL   High  >190     mg/dL   Very High   HIV antibody (Routine Testing)     Status: None   Collection Time: 10/10/16 10:48 PM  Result Value Ref Range   HIV Screen 4th Generation wRfx Non Reactive Non Reactive    Comment: (NOTE) Performed At: [facility] [address] [place], [place] [id number] Lindon Romp MD [id number]   Hemoglobin A1c     Status: None   Collection Time: 10/10/16 10:48 PM  Result Value Ref Range   Hgb A1c MFr Bld 5.6 4.8 - 5.6 %    Comment: (NOTE)         Pre-diabetes: 5.7 - 6.4         Diabetes: >6.4         Glycemic control for adults with diabetes: <7.0    Mean Plasma Glucose 114 mg/dL    Comment: (NOTE) Performed At: [facility] [place], [place] [id number] Lindon Romp MD YI:[id number]   Brain natriuretic peptide     Status: None   Collection Time: 10/10/16 10:48 PM  Result Value Ref Range   B Natriuretic Peptide 17.8 0.0 - 100.0 pg/mL  MRSA PCR Screening     Status: None   Collection Time: 10/11/16  2:10 AM  Result Value Ref Range   MRSA by PCR NEGATIVE NEGATIVE    Comment:        The GeneXpert MRSA Assay (FDA approved for NASAL specimens only), is one component of a comprehensive MRSA colonization surveillance program. It is not intended to diagnose MRSA infection nor to guide or monitor treatment for MRSA infections.   Basic metabolic panel     Status: Abnormal   Collection Time: 10/11/16  3:39 AM    Result Value Ref Range   Sodium 137 135 - 145 mmol/L   Potassium 3.7 3.5 - 5.1 mmol/L   Chloride 103 101 - 111 mmol/L   CO2 26 22 - 32 mmol/L   Glucose, Bld 94 65 - 99 mg/dL   BUN 13 6 - 20 mg/dL   Creatinine, Ser 1.05 0.61 - 1.24 mg/dL   Calcium 8.5 (L) 8.9 - 10.3 mg/dL   GFR calc non Af Amer >60 >60 mL/min   GFR calc Af Amer >60 >60 mL/min    Comment: (NOTE) The eGFR has been calculated using the CKD EPI equation. This calculation has not been validated in all clinical situations. eGFR's persistently <60 mL/min signify possible Chronic Kidney Disease.    Anion gap 8 5 - 15  Lipid panel     Status: Abnormal   Collection Time: 10/11/16  3:39 AM  Result Value Ref Range   Cholesterol 187 0 - 200 mg/dL   Triglycerides 152 (H) <150 mg/dL   HDL 33 (L) >40 mg/dL   Total CHOL/HDL Ratio 5.7 RATIO   VLDL 30 0 - 40 mg/dL   LDL Cholesterol 124 (H) 0 - 99 mg/dL    Comment:        Total Cholesterol/HDL:CHD Risk Coronary Heart Disease Risk Table                     Men   Women  1/2 Average Risk   3.4   3.3  Average Risk       5.0   4.4  2 X Average Risk   9.6   7.1  3 X Average Risk  23.4   11.0        Use the calculated Patient Ratio above and the CHD Risk Table to determine the patient's CHD Risk.        ATP III CLASSIFICATION (LDL):  <100     mg/dL   Optimal  100-129  mg/dL   Near or Above                    Optimal  130-159  mg/dL   Borderline  160-189  mg/dL   High  >190     mg/dL   Very High   CBC     Status: None   Collection Time: 10/11/16  3:39 AM  Result Value Ref Range   WBC 9.6 4.0 - 10.5 K/uL   RBC 4.84 4.22 - 5.81 MIL/uL   Hemoglobin 14.6 13.0 - 17.0 g/dL   HCT 41.9 39.0 - 52.0 %   MCV 86.6 78.0 - 100.0 fL   MCH 30.2 26.0 - 34.0 pg   MCHC 34.8 30.0 - 36.0 g/dL   RDW 13.4 11.5 - 15.5 %   Platelets 211 150 - 400 K/uL  Troponin I     Status: Abnormal   Collection Time: 10/11/16  3:39 AM  Result Value Ref Range   Troponin I 23.58 (HH) <0.03 ng/mL  Comment: CRITICAL RESULT CALLED TO, READ BACK BY AND VERIFIED WITH: MOSELEY C,RN 10/11/16 0446 WAYK   Troponin I     Status: Abnormal   Collection Time: 10/11/16  9:18 AM  Result Value Ref Range   Troponin I 32.89 (HH) <0.03 ng/mL    Comment: CRITICAL VALUE NOTED.  VALUE IS CONSISTENT WITH PREVIOUSLY REPORTED AND CALLED VALUE.  Basic metabolic panel     Status: Abnormal   Collection Time: 10/12/16  2:15 AM  Result Value Ref Range   Sodium 135 135 - 145 mmol/L   Potassium 4.0 3.5 - 5.1 mmol/L   Chloride 102 101 - 111 mmol/L   CO2 25 22 - 32 mmol/L   Glucose, Bld 96 65 - 99 mg/dL   BUN 16 6 - 20 mg/dL   Creatinine, Ser 1.27 (H) 0.61 - 1.24 mg/dL   Calcium 8.7 (L) 8.9 - 10.3 mg/dL   GFR calc non Af Amer >60 >60 mL/min   GFR calc Af Amer >60 >60 mL/min    Comment: (NOTE) The eGFR has been calculated using the CKD EPI equation. This calculation has not been validated in all clinical situations. eGFR's persistently <60 mL/min signify possible Chronic Kidney Disease.    Anion gap 8 5 - 15  CBC     Status: Abnormal   Collection Time: 10/12/16  2:15 AM  Result Value Ref Range   WBC 11.0 (H) 4.0 - 10.5 K/uL   RBC 5.18 4.22 - 5.81 MIL/uL   Hemoglobin 15.6 13.0 - 17.0 g/dL   HCT 45.8 39.0 - 52.0 %   MCV 88.4 78.0 - 100.0 fL   MCH 30.1 26.0 - 34.0 pg   MCHC 34.1 30.0 - 36.0 g/dL   RDW 13.6 11.5 - 15.5 %   Platelets 218 150 - 400 K/uL    Imaging: Dg Chest Port 1 View  Result Date: 10/10/2016 CLINICAL DATA:  ST-elevation myocardial infarction, acute onset. Initial encounter. EXAM: PORTABLE CHEST 1 VIEW COMPARISON:  None. FINDINGS: The lungs are well-aerated and clear. There is no evidence of focal opacification, pleural effusion or pneumothorax. The cardiomediastinal silhouette is within normal limits. No acute osseous abnormalities are seen. IMPRESSION: No acute cardiopulmonary process seen. Electronically Signed   By: Garald Balding M.D.   On: 10/10/2016 23:00     Assessment:  Principal Problem:   STEMI involving left anterior descending coronary artery (HCC) Active Problems:   STEMI (ST elevation myocardial infarction) ([place])   History of tobacco abuse   Dyslipidemia   Plan:  1. No further chest pain overnight. WBC climbing, may be inflammation from infarction. Slight rise in creatinine. Not on ACE-I or ARB yet. Give 500 cc bolus today, encourage po hydration. He is committed to smoking cessation. Up with cardiac rehab today. Ok to transfer to telemetry and possibly d/c home tomorrow.  Time Spent Directly with Patient:  15 minutes  Length of Stay:  LOS: 2 days   Pixie Casino, MD, [facility] Attending Cardiologist [place] 10/12/2016, 8:02 AM

## 2016-10-13 DIAGNOSIS — Z9861 Coronary angioplasty status: Secondary | ICD-10-CM

## 2016-10-13 DIAGNOSIS — I2510 Atherosclerotic heart disease of native coronary artery without angina pectoris: Secondary | ICD-10-CM

## 2016-10-13 DIAGNOSIS — I255 Ischemic cardiomyopathy: Secondary | ICD-10-CM | POA: Diagnosis present

## 2016-10-13 DIAGNOSIS — N179 Acute kidney failure, unspecified: Secondary | ICD-10-CM | POA: Diagnosis present

## 2016-10-13 LAB — CBC
HCT: 45.7 % (ref 39.0–52.0)
HEMOGLOBIN: 15.3 g/dL (ref 13.0–17.0)
MCH: 29.4 pg (ref 26.0–34.0)
MCHC: 33.5 g/dL (ref 30.0–36.0)
MCV: 87.9 fL (ref 78.0–100.0)
Platelets: 219 10*3/uL (ref 150–400)
RBC: 5.2 MIL/uL (ref 4.22–5.81)
RDW: 13.8 % (ref 11.5–15.5)
WBC: 8.9 10*3/uL (ref 4.0–10.5)

## 2016-10-13 LAB — BASIC METABOLIC PANEL
Anion gap: 11 (ref 5–15)
BUN: 15 mg/dL (ref 6–20)
CALCIUM: 9.3 mg/dL (ref 8.9–10.3)
CO2: 24 mmol/L (ref 22–32)
CREATININE: 1.25 mg/dL — AB (ref 0.61–1.24)
Chloride: 101 mmol/L (ref 101–111)
GFR calc Af Amer: >60 mL/min (ref >60)
GFR calc non Af Amer: >60 mL/min (ref >60)
GLUCOSE: 94 mg/dL (ref 65–99)
Potassium: 4 mmol/L (ref 3.5–5.1)
Sodium: 136 mmol/L (ref 135–145)

## 2016-10-13 MED ORDER — ATORVASTATIN CALCIUM 80 MG PO TABS: 80.0000 mg | ORAL_TABLET | Freq: Every day | ORAL | 3 refills | Status: AC

## 2016-10-13 MED ORDER — NITROGLYCERIN 0.4 MG SL SUBL: 0.4000 mg | SUBLINGUAL_TABLET | SUBLINGUAL | 2 refills | Status: AC | PRN

## 2016-10-13 MED ORDER — ASPIRIN 81 MG PO CHEW: 81.0000 mg | CHEWABLE_TABLET | Freq: Every day | ORAL | Status: AC

## 2016-10-13 MED ORDER — TICAGRELOR 90 MG PO TABS: 90.0000 mg | ORAL_TABLET | Freq: Two times a day (BID) | ORAL | 3 refills | Status: AC

## 2016-10-13 MED ORDER — NICOTINE 14 MG/24HR TD PT24: 14.0000 mg | MEDICATED_PATCH | Freq: Every day | TRANSDERMAL | 0 refills | Status: DC

## 2016-10-13 MED ORDER — ACETAMINOPHEN 325 MG PO TABS: 650.0000 mg | ORAL_TABLET | ORAL | Status: AC | PRN

## 2016-10-13 MED ORDER — METOPROLOL TARTRATE 50 MG PO TABS: 50.0000 mg | ORAL_TABLET | Freq: Two times a day (BID) | ORAL | 3 refills | Status: AC

## 2016-10-13 NOTE — Progress Notes (Signed)
Subjective:  Denies SSCP, palpitations or Dyspnea Has chronic issues with sleep and needs a primary care doctor    Objective:  Vitals:   10/12/16 1000 10/12/16 1503 10/12/16 1936 10/13/16 0500  BP: 125/75 (!) 133/95 107/77 103/67  Pulse: 74 77 73 65  Resp: (!) Temp:  98.5 F (36.9 C) 99.3 F (37.4 C) 98.8 F (37.1 C)  TempSrc:  Oral Oral Oral  SpO2: 95% 100% 96% 95%  Weight:      Height:        Intake/Output from previous day:  Intake/Output Summary (Last 24 hours) at 10/13/16 0932 Last data filed at 10/13/16 0700  Gross per 24 hour  Intake              300 ml  Output                0 ml  Net              300 ml    Physical Exam: Affect appropriate Healthy:  appears stated age HEENT: normal Neck supple with no adenopathy JVP normal no bruits no thyromegaly Lungs clear with no wheezing and good diaphragmatic motion Heart:  S1/S2 no murmur, no rub, gallop or click PMI normal Abdomen: benighn, BS positve, no tenderness, no AAA no bruit.  No HSM or HJR Distal pulses intact with no bruits No edema Neuro non-focal Skin warm and dry No muscular weakness Multiple tatoos on arms/ chest Right radial cath sight mild bruising   Lab Results: Basic Metabolic Panel:  Recent Labs  13/24/40 0215 10/13/16 0340  NA 135 136  K 4.0 4.0  CL 102 101  CO2 25 24  GLUCOSE 96 94  BUN 16 15  CREATININE 1.27* 1.25*  CALCIUM 8.7* 9.3   Liver Function Tests:  Recent Labs  10/10/16 2123  AST 33  ALT 28  ALKPHOS 87  BILITOT 0.6  PROT 7.0  ALBUMIN 4.3   No results for input(s): LIPASE, AMYLASE in the last 72 hours. CBC:  Recent Labs  10/10/16 2123  10/12/16 0215 10/13/16 0340  WBC 10.9*  < > 11.0* 8.9  NEUTROABS 7.1  --   --   --   HGB 16.2  < > 15.6 15.3  HCT 46.0  < > 45.8 45.7  MCV 87.1  < > 88.4 87.9  PLT 262  < > 218 219  < > = values in this interval not displayed. Cardiac Enzymes:  Recent Labs  10/10/16 2123 10/11/16 0339  10/11/16 0918  TROPONINI <0.03 23.58* 32.89*   BNP: Invalid input(s): POCBNP D-Dimer: No results for input(s): DDIMER in the last 72 hours. Hemoglobin A1C:  Recent Labs  10/10/16 2248  HGBA1C 5.6   Fasting Lipid Panel:  Recent Labs  10/11/16 0339  CHOL 187  HDL 33*  LDLCALC 124*  TRIG 152*  CHOLHDL 5.7   Thyroid Function Tests: No results for input(s): TSH, T4TOTAL, T3FREE, THYROIDAB in the last 72 hours.  Invalid input(s): FREET3 Anemia Panel: No results for input(s): VITAMINB12, FOLATE, FERRITIN, TIBC, IRON, RETICCTPCT in the last 72 hours.  Imaging: No results found.  Cardiac Studies:  ECG:  NSR normal 10/11/16    Telemetry:  NSR no arrhythmia 10/13/2016   Echo: EF 45-50% personally reviewed akinesis of mid anteroseptal and apical septal walls   Medications:   . aspirin  81 mg Oral Daily  . aspirin EC  81 mg Oral Daily  . atorvastatin  80 mg Oral q1800  . heparin  5,000 Units Subcutaneous Q8H  . metoprolol tartrate  50 mg Oral BID  . nicotine  14 mg Transdermal Daily  . sodium chloride flush  3 mL Intravenous Q12H  . ticagrelor  90 mg Oral BID      Assessment/Plan:  Anterior MI:  Post stent to proximal and mid LAD. Mildly reduced EF would get f/u echo or MRI in 3 months To assess remodeling. Continue DAT and beta blocker consider ACE as outpatient. Discussed use of melatonin Or benedryl for sleep. D/c home TOC with PA and outpatient f/u with Dr [place]  Elgie Landino [facility] 10/13/2016, 9:32 AM

## 2016-10-13 NOTE — Care Management Note (Signed)
Case Management Note  Patient Details  Name: SHIVAAY STORMONT MRN: [id number] Date of Birth: [birth year]  Subjective/Objective:      STEMI              Action/Plan: Discharge Planning:  Pt has [facility] card and will pick up at [facility] on [facility] [address] [facility].    Expected Discharge Date:                  Expected Discharge Plan:  Home/Self Care  In-House Referral:  NA  Discharge planning Services  CM Consult, Medication Assistance  Post Acute Care Choice:  NA Choice offered to:  NA  DME Arranged:  N/A DME Agency:  NA  HH Arranged:  NA HH Agency:  NA  Status of Service:  Completed, signed off  If discussed at Long Length of Stay Meetings, dates discussed:    Additional Comments:  Elliot Cousin, RN 10/13/2016, 10:51 AM

## 2016-10-13 NOTE — Discharge Instructions (Signed)
Steps to Quit Smoking Smoking tobacco can be bad for your health. It can also affect almost every organ in your body. Smoking puts you and people around you at risk for many serious long-lasting (chronic) diseases. Quitting smoking is hard, but it is one of the best things that you can do for your health. It is never too late to quit. What are the benefits of quitting smoking? When you quit smoking, you lower your risk for getting serious diseases and conditions. They can include:  Lung cancer or lung disease.  Heart disease.  Stroke.  Heart attack.  Not being able to have children (infertility).  Weak bones (osteoporosis) and broken bones (fractures). If you have coughing, wheezing, and shortness of breath, those symptoms may get better when you quit. You may also get sick less often. If you are pregnant, quitting smoking can help to lower your chances of having a baby of low birth weight. What can I do to help me quit smoking? Talk with your doctor about what can help you quit smoking. Some things you can do (strategies) include:  Quitting smoking totally, instead of slowly cutting back how much you smoke over a period of time.  Going to in-person counseling. You are more likely to quit if you go to many counseling sessions.  Using resources and support systems, such as:  Online chats with a Social worker.  Phone quitlines.  Printed Furniture conservator/restorer.  Support groups or group counseling.  Text messaging programs.  Mobile phone apps or applications.  Taking medicines. Some of these medicines may have nicotine in them. If you are pregnant or breastfeeding, do not take any medicines to quit smoking unless your doctor says it is okay. Talk with your doctor about counseling or other things that can help you. Talk with your doctor about using more than one strategy at the same time, such as taking medicines while you are also going to in-person counseling. This can help make quitting  easier. What things can I do to make it easier to quit? Quitting smoking might feel very hard at first, but there is a lot that you can do to make it easier. Take these steps:  Talk to your family and friends. Ask them to support and encourage you.  Call phone quitlines, reach out to support groups, or work with a Social worker.  Ask people who smoke to not smoke around you.  Avoid places that make you want (trigger) to smoke, such as:  Bars.  Parties.  Smoke-break areas at work.  Spend time with people who do not smoke.  Lower the stress in your life. Stress can make you want to smoke. Try these things to help your stress:  Getting regular exercise.  Deep-breathing exercises.  Yoga.  Meditating.  Doing a body scan. To do this, close your eyes, focus on one area of your body at a time from head to toe, and notice which parts of your body are tense. Try to relax the muscles in those areas.  Download or buy apps on your mobile phone or tablet that can help you stick to your quit plan. There are many free apps, such as QuitGuide from the [employer] Office manager for Disease Control and Prevention). You can find more support from smokefree.gov and other websites. This information is not intended to replace advice given to you by your health care provider. Make sure you discuss any questions you have with your health care provider. Document Released: 04/27/2009 Document Revised: 02/27/2016 Document  Reviewed: 11/15/2014 Elsevier Interactive Patient Education  2017 Elsevier Inc. Heart Attack A heart attack (myocardial infarction, MI) causes damage to the heart that cannot be fixed. A heart attack often happens when a blood clot or other blockage cuts blood flow to the heart. When this happens, certain areas of the heart begin to die. This causes the pain you feel during a heart attack. Follow these instructions at home:  Take medicine as told by your doctor. You may need medicine to:  Keep your  blood from clotting too easily.  Control your blood pressure.  Lower your cholesterol.  Control abnormal heart rhythms.  Change certain behaviors as told by your doctor. This may include:  Quitting smoking.  Being active.  Eating a heart-healthy diet. Ask your doctor for help with this diet.  Keeping a healthy weight.  Keeping your diabetes under control.  Lessening stress.  Limiting how much alcohol you drink. Do not take these medicines unless your doctor says that you can:  Nonsteroidal anti-inflammatory drugs (NSAIDs). These include:  Ibuprofen.  Naproxen.  Celecoxib.  Vitamin supplements that have vitamin A, vitamin E, or both.  Hormone therapy that contains estrogen with or without progestin. Get help right away if:  You have sudden chest discomfort.  You have sudden discomfort in your:  Arms.  Back.  Neck.  Jaw.  You have shortness of breath at any time.  You have sudden sweating or clammy skin.  You feel sick to your stomach (nauseous) or throw up (vomit).  You suddenly get light-headed or dizzy.  You feel your heart beating fast or skipping beats. These symptoms may be an emergency. Do not wait to see if the symptoms will go away. Get medical help right away. Call your local emergency services (911 in the [place] Do not drive yourself to the hospital. This information is not intended to replace advice given to you by your health care provider. Make sure you discuss any questions you have with your health care provider. Document Released: 12/31/2011 Document Revised: 12/07/2015 Document Reviewed: 09/03/2013 Elsevier Interactive Patient Education  2017 Elsevier Inc. Coronary Angiogram With Stent, Care After This sheet gives you information about how to care for yourself after your procedure. Your health care provider may also give you more specific instructions. If you have problems or questions, contact your health care provider. What can I  expect after the procedure? After your procedure, it is common to have:  Bruising in the area where a small, thin tube (catheter) was inserted. This usually fades within 1-2 weeks.  Blood collecting in the tissue (hematoma) that may be painful to the touch. It should usually decrease in size and tenderness within 1-2 weeks. Follow these instructions at home: Insertion area care   Do not take baths, swim, or use a hot tub until your health care provider approves.  You may shower 24-48 hours after the procedure or as directed by your health care provider.  Follow instructions from your health care provider about how to take care of your incision. Make sure you:  Wash your hands with soap and water before you change your bandage (dressing). If soap and water are not available, use hand sanitizer.  Change your dressing as told by your health care provider.  Leave stitches (sutures), skin glue, or adhesive strips in place. These skin closures may need to stay in place for 2 weeks or longer. If adhesive strip edges start to loosen and curl up, you may trim the loose  edges. Do not remove adhesive strips completely unless your health care provider tells you to do that.  Remove the bandage (dressing) and gently wash the catheter insertion site with plain soap and water.  Pat the area dry with a clean towel. Do not rub the area, because that may cause bleeding.  Do not apply powder or lotion to the incision area.  Check your incision area every day for signs of infection. Check for:  More redness, swelling, or pain.  More fluid or blood.  Warmth.  Pus or a bad smell. Activity   Do not drive for 24 hours if you were given a medicine to help you relax (sedative).  Do not lift anything that is heavier than 10 lb (4.5 kg) for 5 days after your procedure or as directed by your health care provider.  Ask your health care provider when it is okay for you:  To return to work or  school.  To resume usual physical activities or sports.  To resume sexual activity. Eating and drinking   Eat a heart-healthy diet. This should include plenty of fresh fruits and vegetables.  Avoid the following types of food:  Food that is high in salt.  Canned or highly processed food.  Food that is high in saturated fat or sugar.  Fried food.  Limit alcohol intake to no more than 1 drink a day for non-pregnant women and 2 drinks a day for men. One drink equals 12 oz of beer, 5 oz of wine, or 1 oz of hard liquor. Lifestyle   Do not use any products that contain nicotine or tobacco, such as cigarettes and e-cigarettes. If you need help quitting, ask your health care provider.  Take steps to manage and control your weight.  Get regular exercise.  Manage your blood pressure.  Manage other health problems, such as diabetes. General instructions   Take over-the-counter and prescription medicines only as told by your health care provider. Blood thinners may be prescribed after your procedure to improve blood flow through the stent.  If you need an MRI after your heart stent has been placed, be sure to tell the health care provider who orders the MRI that you have a heart stent.  Keep all follow-up visits as directed by your health care provider. This is important. Contact a health care provider if:  You have a fever.  You have chills.  You have increased bleeding from the catheter insertion area. Hold pressure on the area. Get help right away if:  You develop chest pain or shortness of breath.  You feel faint or you pass out.  You have unusual pain at the catheter insertion area.  You have redness, warmth, or swelling at the catheter insertion area.  You have drainage (other than a small amount of blood on the dressing) from the catheter insertion area.  The catheter insertion area is bleeding, and the bleeding does not stop after 30 minutes of holding steady  pressure on the area.  You develop bleeding from any other place, such as from your rectum. There may be bright red blood in your urine or stool, or it may appear as black, tarry stool. This information is not intended to replace advice given to you by your health care provider. Make sure you discuss any questions you have with your health care provider. Document Released: 01/18/2005 Document Revised: 03/28/2016 Document Reviewed: 03/28/2016 Elsevier Interactive Patient Education  2017 [employer].

## 2016-10-13 NOTE — Progress Notes (Signed)
Discharge instructions printed and reviewed with patient, and copy given for them to take home. All questions addressed at this time. New prescriptions reviewed and paper script given to patient to fill after discharge. IV's and tele box removed. Room searched for patient belongings, and confirmed with patient that all valuables were accounted for. Awaiting patient's ride to arrive to take him home. Will continue to monitor.

## 2016-10-13 NOTE — Discharge Summary (Signed)
Patient ID: Travis Levine,  MRN: [id number], DOB/AGE: Jun 17, 1966 51 y.o.  Admit date: 10/10/2016 Discharge date: 10/13/2016  Primary Care Provider: No primary care provider on file. Primary Cardiologist: Dr Swaziland  Discharge Diagnoses Principal Problem:   STEMI involving left anterior descending coronary artery [facility]) Active Problems:   History of tobacco abuse   Dyslipidemia   CAD S/P percutaneous coronary angioplasty   AKI (acute kidney injury) (HCC)   Ischemic cardiomyopathy    Procedures: Urgent Cath/ PCI with DES to LAD 10/10/16                        Echo 10/11/16  Hospital Course 51 y/o male, no history of CAD, presented 10/10/16 with AW STEMI. His symptoms started as "indigestion". He initially went to [facility]. He had symptoms for 1.5 hours before calling EMS. In the ED he had EKG changes concerning for anterior STEMI and he was taken urgently to the cath lab. Cath revealed single vessel disease with a 100% proximal LAD. He underwent PCI with DES placement. His EKG normalized post PCI. Troponin peaked at 32.89. ECHO showed his EF to be 45-50%. He has been started on ASA, Brilinta, Lopressor, Lipitor 80 mg. He did have a bump in his SCr to 1.3. ACE could be considered as an OP. He'll need an echo in 3 months. Dr Eden Emms feels he is stable for discharge 10/13/16. I'll arrange for an early f/u.  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   Discharge Vitals:  Blood pressure 103/67, pulse 65, temperature 98.8 F (37.1 C), temperature source Oral, resp. rate 18, height  (1.727 m), weight 194 lb 10.7 oz (88.3 kg), SpO2 95 %.    Labs: Results for orders placed or performed during the hospital encounter of 10/10/16 (from the past 24 hour(s))  Basic metabolic panel     Status: Abnormal   Collection Time: 10/13/16  3:40 AM  Result Value Ref Range   Sodium 136 135 - 145 mmol/L   Potassium 4.0 3.5 - 5.1 mmol/L   Chloride 101 101 - 111 mmol/L    CO2 24 22 - 32 mmol/L   Glucose, Bld 94 65 - 99 mg/dL   BUN 15 6 - 20 mg/dL   Creatinine, Ser 4.09 (H) 0.61 - 1.24 mg/dL   Calcium 9.3 8.9 - 81.1 mg/dL   GFR calc non Af Amer >60 >60 mL/min   GFR calc Af Amer >60 >60 mL/min   Anion gap 11 5 - 15  CBC     Status: None   Collection Time: 10/13/16  3:40 AM  Result Value Ref Range   WBC 8.9 4.0 - 10.5 K/uL   RBC 5.20 4.22 - 5.81 MIL/uL   Hemoglobin 15.3 13.0 - 17.0 g/dL   HCT 91.4 78.2 - 95.6 %   MCV 87.9 78.0 - 100.0 fL   MCH 29.4 26.0 - 34.0 pg   MCHC 33.5 30.0 - 36.0 g/dL   RDW 21.3 08.6 - 57.8 %   Platelets 219 150 - 400 K/uL    Disposition:  Follow-up Information    Corine Shelter, PA-C Follow up.   Specialties:  Cardiology, Radiology Why:  Office will contact you with am appointment Contact information: [address] [place] [address] [phone]           Discharge Medications:  Allergies as of 10/13/2016  Reactions   Sulfa Antibiotics Rash      Medication List    TAKE these medications   acetaminophen 325 MG tablet Commonly known as:  TYLENOL Take 2 tablets (650 mg total) by mouth every 4 (four) hours as needed for headache or mild pain.   aspirin 81 MG chewable tablet Chew 1 tablet (81 mg total) by mouth daily. Start taking on:  10/14/2016   atorvastatin 80 MG tablet Commonly known as:  LIPITOR Take 1 tablet (80 mg total) by mouth daily at 6 PM.   metoprolol 50 MG tablet Commonly known as:  LOPRESSOR Take 1 tablet (50 mg total) by mouth 2 (two) times daily.   nicotine 14 mg/24hr patch Commonly known as:  NICODERM CQ - dosed in mg/24 hours Place 1 patch (14 mg total) onto the skin daily. Start taking on:  10/14/2016   nitroGLYCERIN 0.4 MG SL tablet Commonly known as:  NITROSTAT Place 1 tablet (0.4 mg total) under the tongue every 5 (five) minutes x 3 doses as needed for chest pain.   ticagrelor 90 MG Tabs tablet Commonly known as:  BRILINTA Take 1 tablet (90 mg total) by  mouth 2 (two) times daily.        Duration of Discharge Encounter: Greater than 30 minutes including physician time.  Jolene Provost PA-C 10/13/2016 11:10 AM

## 2016-10-15 ENCOUNTER — Telehealth (HOSPITAL_COMMUNITY): Payer: Self-pay | Admitting: General Practice

## 2016-10-15 NOTE — Telephone Encounter (Signed)
Patient has [employer] active and benefits verified. BCBS - no co-payment, deductible $1500/$379.76 has been met, out of pocket $3000/$379.76 has been met, no co-insurance, no pre-authorization and no limit on visit. Passport/reference # [id number]. Waiting on hospital follow up appointment with [facility] before proceeding with referral.

## 2016-10-25 ENCOUNTER — Encounter: Payer: Self-pay | Admitting: Physician Assistant

## 2016-10-25 ENCOUNTER — Ambulatory Visit (INDEPENDENT_AMBULATORY_CARE_PROVIDER_SITE_OTHER): Payer: BLUE CROSS/BLUE SHIELD | Admitting: Physician Assistant

## 2016-10-25 ENCOUNTER — Telehealth: Payer: Self-pay | Admitting: Physician Assistant

## 2016-10-25 VITALS — BP 122/80 | HR 81 | Ht 68.5 in | Wt 196.8 lb

## 2016-10-25 DIAGNOSIS — I2102 ST elevation (STEMI) myocardial infarction involving left anterior descending coronary artery: Secondary | ICD-10-CM

## 2016-10-25 DIAGNOSIS — I2510 Atherosclerotic heart disease of native coronary artery without angina pectoris: Secondary | ICD-10-CM

## 2016-10-25 DIAGNOSIS — Z720 Tobacco use: Secondary | ICD-10-CM

## 2016-10-25 DIAGNOSIS — Z79899 Other long term (current) drug therapy: Secondary | ICD-10-CM | POA: Diagnosis not present

## 2016-10-25 DIAGNOSIS — E785 Hyperlipidemia, unspecified: Secondary | ICD-10-CM | POA: Diagnosis not present

## 2016-10-25 NOTE — Patient Instructions (Signed)
Medication Instructions:  Your physician recommends that you continue on your current medications as directed. Please refer to the Current Medication list given to you today.  If you need a refill on your cardiac medications before your next appointment, please call your pharmacy.  Labwork: FLP AND LFT IN 6 WEEKS (11-29-16) AT [facility] ON THE 1ST FLOOR  Follow-Up: Your physician wants you to follow-up in: 2 MONTHS WITH DR Swaziland Y  Special Instructions: OK TO RETURN TO WORK 11-04-2016    Thank you for choosing [facility] at [facility]!!    HAO MENG, PA-C [place], LPN

## 2016-10-25 NOTE — Telephone Encounter (Signed)
10/25/2016 Received FMLA Disability forms back from [employer] and the forms was given to [facility], PA-C. cbr

## 2016-10-25 NOTE — Progress Notes (Signed)
Cardiology Office Note    Date:  10/26/2016   ID:  JATINDER MCDONAGH, DOB Mar 21, 1966, MRN [id number]  PCP:  No PCP Per Patient  Cardiologist:  Dr. [place]   Chief Complaint  Patient presents with  . Follow-up    office visit; Pt states no Sx. Seen for Dr. [place]    History of Present Illness:  Travis Levine is a 51 y.o. male with tobacco abuse who was recently admitted to the hospital on 10/10/2016 with chest pain. Initial EKG was concerning for anterior STEMI. Cardiac catheterization performed on 10/10/2016 showed EF 45-50%, percent occluded proximal to mid LAD lesion treated with Promus Premiere 3.5 x 20 mm DES. Echocardiogram obtained on 10/11/2016 showed EF 45-50%, mild LVH, grade 1 diastolic dysfunction. Post procedure, patient was started on aspirin, Brilinta, metoprolol and high-dose Lipitor. During the recent admission, his troponin trended up to 32.89.  Since discharge, he has been doing well, he denies any recurrent chest discomfort. EKG today does show T wave inversion from V2 through V5 which is likely evolutionary changes after his recent MI. He has been compliant with his medication. We further discussed the need to stop smoking. He will need to be set up with a primary care physician. Otherwise, I'm okay with him going back to work on 11/04/2016, I will fill out his FMLA form.   Past Medical History:  Diagnosis Date  . Acute pericarditis   . CAD (coronary artery disease)   . Chest pain   . History of tobacco abuse     Past Surgical History:  Procedure Laterality Date  . CORONARY/GRAFT ACUTE MI REVASCULARIZATION N/A 10/10/2016   Procedure: Coronary/Graft Acute MI Revascularization;  Surgeon: Peter M Swaziland, MD;  Location: [facility] INVASIVE CV LAB;  Service: Cardiovascular;  Laterality: N/A;  . HAND SURGERY    . LEFT HEART CATH AND CORONARY ANGIOGRAPHY N/A 10/10/2016   Procedure: Left Heart Cath and Coronary Angiography;  Surgeon: Peter M [place]az[place]and, MD;  Location: [facility] INVASIVE CV  LAB;  Service: Cardiovascular;  Laterality: N/A;  . TOE SURGERY     left great toe    Current Medications: Outpatient Medications Prior to Visit  Medication Sig Dispense Refill  . acetaminophen (TYLENOL) 325 MG tablet Take 2 tablets (650 mg total) by mouth every 4 (four) hours as needed for headache or mild pain.    Marland Kitchen aspirin 81 MG chewable tablet Chew 1 tablet (81 mg total) by mouth daily.    Marland Kitchen atorvastatin (LIPITOR) 80 MG tablet Take 1 tablet (80 mg total) by mouth daily at 6 PM. 90 tablet 3  . metoprolol (LOPRESSOR) 50 MG tablet Take 1 tablet (50 mg total) by mouth 2 (two) times daily. 180 tablet 3  . nicotine (NICODERM CQ - DOSED IN MG/24 HOURS) 14 mg/24hr patch Place 1 patch (14 mg total) onto the skin daily. 28 patch 0  . nitroGLYCERIN (NITROSTAT) 0.4 MG SL tablet Place 1 tablet (0.4 mg total) under the tongue every 5 (five) minutes x 3 doses as needed for chest pain. 25 tablet 2  . ticagrelor (BRILINTA) 90 MG TABS tablet Take 1 tablet (90 mg total) by mouth 2 (two) times daily. 180 tablet 3   No facility-administered medications prior to visit.      Allergies:   Sulfa antibiotics   Social History   Social History  . Marital status: Married    Spouse name: N/A  . Number of children: N/A  . Years of education: N/A  Social History Main Topics  . Smoking status: Current Every Day Smoker  . Smokeless tobacco: Never Used     Comment: 25 years, 1ppd  . Alcohol use Yes     Comment: former heavy alcohol use, now rarely  . 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 Sexual activity: Not Asked   Other Topics Concern  . None   Social History Narrative  . None     Family History:  The patient's family history includes Heart attack in his father and paternal grandfather; Hypertension in his mother.   ROS:   Please see the history of present illness.    ROS All other systems reviewed and are negative.   PHYSICAL EXAM:   VS:  BP 122/80   Pulse 81   Ht  5' 8.5" (1.74 m)   Wt 196 lb 12.8 oz (89.3 kg)   BMI 29.49 kg/m    GEN: Well nourished, well developed, in no acute distress  HEENT: normal  Neck: no JVD, carotid bruits, or masses Cardiac: RRR; no murmurs, rubs, or gallops,no edema  Respiratory:  clear to auscultation bilaterally, normal work of breathing GI: soft, nontender, nondistended, + BS MS: no deformity or atrophy  Skin: warm and dry, no rash Neuro:  Alert and Oriented x 3, Strength and sensation are intact Psych: euthymic mood, full affect  Wt Readings from Last 3 Encounters:  10/25/16 196 lb 12.8 oz (89.3 kg)  10/10/16 194 lb 10.7 oz (88.3 kg)      Studies/Labs Reviewed:   EKG:  EKG is ordered today.  The ekg ordered today demonstrates Normal sinus rhythm with T-wave inversion in V2 through V5  Recent Labs: 10/10/2016: ALT 28; B Natriuretic Peptide 17.8 10/13/2016: BUN 15; Creatinine, Ser 1.25; Hemoglobin 15.3; Platelets 219; Potassium 4.0; Sodium 136   Lipid Panel    Component Value Date/Time   CHOL 187 10/11/2016 0339   TRIG 152 (H) 10/11/2016 0339   HDL 33 (L) 10/11/2016 0339   CHOLHDL 5.7 10/11/2016 0339   VLDL 30 10/11/2016 0339   LDLCALC 124 (H) 10/11/2016 0339    Additional studies/ records that were reviewed today include:   Cath 10/10/2016 Conclusion     There is mild left ventricular systolic dysfunction.  The left ventricular ejection fraction is 45-50% by visual estimate.  LV end diastolic pressure is normal.  A STENT PROMUS PREM MR 3.5X20 drug eluting stent was successfully placed.  Prox LAD to Mid LAD lesion, 100 %stenosed.  Post intervention, there is a 0% residual stenosis.   1. 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 Mild LV dysfunction 3. Normal LVEDP 4. Successful stenting of the LAD with DES  Plan: DAPT for one year. Smoking cessation. Statin therapy. Beta blocker.      Echo 10/11/2016 LV EF: 45% -   50%  - Left ventricle: The cavity size was normal.  There was mild   concentric hypertrophy. Systolic function was mildly reduced. The   estimated ejection fraction was in the range of 45% to 50%. Wall   motion was normal; there were no regional wall motion   abnormalities. Doppler parameters are consistent with abnormal   left ventricular relaxation (grade 1 diastolic dysfunction).   Doppler parameters are consistent with elevated ventricular   end-diastolic filling pressure. - Aortic valve: Trileaflet; normal thickness leaflets. There was no   regurgitation. - Aortic root: The aortic root was normal in size. -  Mitral valve: Structurally normal valve. - Right ventricle: Systolic function was normal. - Tricuspid valve: There was trivial regurgitation. - Pulmonary arteries: Systolic pressure was within the normal   range. - Inferior vena cava: The vessel was normal in size. - Pericardium, extracardiac: There was no pericardial effusion.  Impressions:  - There is akinesis of the mid anteroseptal, apical septal,   anterior walls and of the true apex. LVEF 45-50%. No thrombus in   the LV apex on Definity images.   ASSESSMENT:    1. STEMI involving left anterior descending coronary artery (HCC)   2. Medication management   3. Coronary artery disease involving native coronary artery of native heart without angina pectoris   4. Hyperlipidemia, unspecified hyperlipidemia type   5. Tobacco abuse      PLAN:  In order of problems listed above:  1. CAD s/p anterior MI: We discussed potential risk factors including age, family history, tobacco abuse, hypertension, hyperlipidemia and diabetes. His hemoglobin A1c was 5.6, I encouraged continued to monitor diet and exercise. He has been eating more fruit and vegetables since his recent MI. He does have new T wave inversion in the anterior lead on today's EKG, I have reviewed this with my colleagues, this is likely evolutionary changes given the lack of chest discomfort. We further abscess  needs to be compliant with aspirin and Brilinta. I have filled out his [employer] form to go back to work on 11/04/2016  2. Hyperlipidemia: LDL was elevated in the hospital, continue on Lipitor, fasting lipid panel and LFTs in 6 weeks.  3. Tobacco abuse: He continued to smoke at this time, however has made the effort to cut back. He is also eating healthier as well.   Medication Adjustments/Labs and Tests Ordered: Current medicines are reviewed at length with the patient today.  Concerns regarding medicines are outlined above.  Medication changes, Labs and Tests ordered today are listed in the Patient Instructions below. Patient Instructions  Medication Instructions:  Your physician recommends that you continue on your current medications as directed. Please refer to the Current Medication list given to you today.  If you need a refill on your cardiac medications before your next appointment, please call your pharmacy.  Labwork: FLP AND LFT IN 6 WEEKS (11-29-16) AT [facility] ON THE 1ST FLOOR  Follow-Up: Your physician wants you to follow-up in: 2 MONTHS WITH DR [place]and Y  Special Instructions: OK TO RETURN TO WORK 11-04-2016    Thank you for choosing [facility] at [facility]!!    Aura Bibby, PA-C [place], LPN     Ramond Dial, [place]  10/26/2016 9:03 AM    [facility] [address] [place], [place], [place]  [address] Phone: [phone]; Fax: [phone]

## 2016-10-26 ENCOUNTER — Other Ambulatory Visit: Payer: Self-pay | Admitting: Physician Assistant

## 2016-10-26 ENCOUNTER — Encounter: Payer: Self-pay | Admitting: Physician Assistant

## 2016-10-26 DIAGNOSIS — E785 Hyperlipidemia, unspecified: Secondary | ICD-10-CM | POA: Insufficient documentation

## 2016-10-29 ENCOUNTER — Telehealth: Payer: Self-pay | Admitting: Cardiology

## 2016-10-29 NOTE — Telephone Encounter (Signed)
Returned call to patient he stated he was returning someone's call.Spoke to [place] in our medical records she will call patient about his FMLA paperwork.

## 2016-10-29 NOTE — Telephone Encounter (Signed)
New Message  Pt voiced returning someone's call but there are no conversations for this pts encounter.  Please f/u

## 2016-11-06 ENCOUNTER — Telehealth: Payer: Self-pay | Admitting: Cardiology

## 2016-11-06 ENCOUNTER — Encounter (HOSPITAL_COMMUNITY)
Admission: EM | Disposition: A | Discharge status: Home / Self Care | Payer: Self-pay | Source: Home / Self Care | Attending: Cardiology

## 2016-11-06 ENCOUNTER — Encounter (HOSPITAL_COMMUNITY): Payer: Self-pay | Admitting: Emergency Medicine

## 2016-11-06 ENCOUNTER — Telehealth (HOSPITAL_COMMUNITY): Payer: Self-pay | Admitting: General Practice

## 2016-11-06 ENCOUNTER — Inpatient Hospital Stay (HOSPITAL_COMMUNITY)
Admission: EM | Admit: 2016-11-06 | Discharge: 2016-11-06 | DRG: 281 | Disposition: A | Discharge status: Home / Self Care | Payer: BLUE CROSS/BLUE SHIELD | Attending: Cardiology | Admitting: Cardiology

## 2016-11-06 DIAGNOSIS — I25110 Atherosclerotic heart disease of native coronary artery with unstable angina pectoris: Secondary | ICD-10-CM | POA: Diagnosis not present

## 2016-11-06 DIAGNOSIS — R61 Generalized hyperhidrosis: Secondary | ICD-10-CM | POA: Diagnosis present

## 2016-11-06 DIAGNOSIS — I200 Unstable angina: Secondary | ICD-10-CM | POA: Diagnosis not present

## 2016-11-06 DIAGNOSIS — I2109 ST elevation (STEMI) myocardial infarction involving other coronary artery of anterior wall: Secondary | ICD-10-CM | POA: Diagnosis present

## 2016-11-06 DIAGNOSIS — I2510 Atherosclerotic heart disease of native coronary artery without angina pectoris: Secondary | ICD-10-CM | POA: Diagnosis present

## 2016-11-06 DIAGNOSIS — N179 Acute kidney failure, unspecified: Secondary | ICD-10-CM | POA: Diagnosis present

## 2016-11-06 DIAGNOSIS — Z7982 Long term (current) use of aspirin: Secondary | ICD-10-CM

## 2016-11-06 DIAGNOSIS — Z79899 Other long term (current) drug therapy: Secondary | ICD-10-CM

## 2016-11-06 DIAGNOSIS — E785 Hyperlipidemia, unspecified: Secondary | ICD-10-CM | POA: Diagnosis present

## 2016-11-06 DIAGNOSIS — Z8249 Family history of ischemic heart disease and other diseases of the circulatory system: Secondary | ICD-10-CM

## 2016-11-06 DIAGNOSIS — Z955 Presence of coronary angioplasty implant and graft: Secondary | ICD-10-CM | POA: Diagnosis not present

## 2016-11-06 DIAGNOSIS — I208 Other forms of angina pectoris: Secondary | ICD-10-CM | POA: Diagnosis not present

## 2016-11-06 DIAGNOSIS — R0789 Other chest pain: Secondary | ICD-10-CM | POA: Diagnosis present

## 2016-11-06 DIAGNOSIS — R079 Chest pain, unspecified: Secondary | ICD-10-CM

## 2016-11-06 DIAGNOSIS — Z87891 Personal history of nicotine dependence: Secondary | ICD-10-CM

## 2016-11-06 DIAGNOSIS — I255 Ischemic cardiomyopathy: Secondary | ICD-10-CM | POA: Diagnosis present

## 2016-11-06 DIAGNOSIS — I252 Old myocardial infarction: Secondary | ICD-10-CM

## 2016-11-06 DIAGNOSIS — Z9861 Coronary angioplasty status: Secondary | ICD-10-CM

## 2016-11-06 DIAGNOSIS — Z7902 Long term (current) use of antithrombotics/antiplatelets: Secondary | ICD-10-CM

## 2016-11-06 HISTORY — PX: LEFT HEART CATH AND CORONARY ANGIOGRAPHY: CATH118249

## 2016-11-06 LAB — CBC WITH DIFFERENTIAL/PLATELET
BASOS PCT: 1 %
Basophils Absolute: 0 10*3/uL (ref 0.0–0.1)
EOS ABS: 0.3 10*3/uL (ref 0.0–0.7)
Eosinophils Relative: 4 %
HCT: 42.8 % (ref 39.0–52.0)
HEMOGLOBIN: 15.2 g/dL (ref 13.0–17.0)
Lymphocytes Relative: 29 %
Lymphs Abs: 2.3 10*3/uL (ref 0.7–4.0)
MCH: 30.9 pg (ref 26.0–34.0)
MCHC: 35.5 g/dL (ref 30.0–36.0)
MCV: 87 fL (ref 78.0–100.0)
MONOS PCT: 7 %
Monocytes Absolute: 0.6 10*3/uL (ref 0.1–1.0)
NEUTROS PCT: 59 %
Neutro Abs: 4.7 10*3/uL (ref 1.7–7.7)
Platelets: 196 10*3/uL (ref 150–400)
RBC: 4.92 MIL/uL (ref 4.22–5.81)
RDW: 13.4 % (ref 11.5–15.5)
WBC: 7.9 10*3/uL (ref 4.0–10.5)

## 2016-11-06 LAB — COMPREHENSIVE METABOLIC PANEL
ALBUMIN: 4.1 g/dL (ref 3.5–5.0)
ALK PHOS: 98 U/L (ref 38–126)
ALT: 43 U/L (ref 17–63)
ANION GAP: 9 (ref 5–15)
AST: 40 U/L (ref 15–41)
BUN: 21 mg/dL — ABNORMAL HIGH (ref 6–20)
CALCIUM: 9 mg/dL (ref 8.9–10.3)
CO2: 23 mmol/L (ref 22–32)
Chloride: 105 mmol/L (ref 101–111)
Creatinine, Ser: 1.29 mg/dL — ABNORMAL HIGH (ref 0.61–1.24)
GFR calc Af Amer: >60 mL/min (ref >60)
GFR calc non Af Amer: >60 mL/min (ref >60)
GLUCOSE: 117 mg/dL — AB (ref 65–99)
POTASSIUM: 4.1 mmol/L (ref 3.5–5.1)
SODIUM: 137 mmol/L (ref 135–145)
Total Bilirubin: 0.7 mg/dL (ref 0.3–1.2)
Total Protein: 6.7 g/dL (ref 6.5–8.1)

## 2016-11-06 LAB — TROPONIN I
Troponin I: <0.03 ng/mL (ref <0.03)
Troponin I: <0.03 ng/mL (ref <0.03)
Troponin I: <0.03 ng/mL (ref <0.03)

## 2016-11-06 LAB — PROTIME-INR
INR: 1.15
PROTHROMBIN TIME: 14.8 s (ref 11.4–15.2)

## 2016-11-06 LAB — I-STAT TROPONIN, ED: TROPONIN I, POC: 0 ng/mL (ref 0.00–0.08)

## 2016-11-06 LAB — BRAIN NATRIURETIC PEPTIDE: B Natriuretic Peptide: 277.3 pg/mL — ABNORMAL HIGH (ref 0.0–100.0)

## 2016-11-06 SURGERY — LEFT HEART CATH AND CORONARY ANGIOGRAPHY
Anesthesia: LOCAL

## 2016-11-06 MED ORDER — TICAGRELOR 90 MG PO TABS
90.0000 mg | ORAL_TABLET | Freq: Two times a day (BID) | ORAL | Status: DC
  Administered 2016-11-06: 90 mg via ORAL
  Filled 2016-11-06: qty 1

## 2016-11-06 MED ORDER — MIDAZOLAM HCL 2 MG/2ML IJ SOLN
INTRAMUSCULAR | Status: AC
  Filled 2016-11-06: qty 2

## 2016-11-06 MED ORDER — HEPARIN BOLUS VIA INFUSION
4000.0000 [IU] | Freq: Once | INTRAVENOUS | Status: AC
  Administered 2016-11-06: 4000 [IU] via INTRAVENOUS
  Filled 2016-11-06: qty 4000

## 2016-11-06 MED ORDER — SODIUM CHLORIDE 0.9 % IV SOLN: 250.0000 mL | INTRAVENOUS | Status: DC | PRN

## 2016-11-06 MED ORDER — SODIUM CHLORIDE 0.9% FLUSH: 3.0000 mL | Freq: Two times a day (BID) | INTRAVENOUS | Status: DC

## 2016-11-06 MED ORDER — SODIUM CHLORIDE 0.9 % WEIGHT BASED INFUSION: 1.0000 mL/kg/h | INTRAVENOUS | Status: DC

## 2016-11-06 MED ORDER — MIDAZOLAM HCL 2 MG/2ML IJ SOLN
INTRAMUSCULAR | Status: DC | PRN
  Administered 2016-11-06: 2 mg via INTRAVENOUS

## 2016-11-06 MED ORDER — ONDANSETRON HCL 4 MG/2ML IJ SOLN: 4.0000 mg | Freq: Four times a day (QID) | INTRAMUSCULAR | Status: DC | PRN

## 2016-11-06 MED ORDER — METOPROLOL TARTRATE 50 MG PO TABS
50.0000 mg | ORAL_TABLET | Freq: Two times a day (BID) | ORAL | Status: DC
  Administered 2016-11-06: 50 mg via ORAL
  Filled 2016-11-06: qty 1

## 2016-11-06 MED ORDER — ACETAMINOPHEN 325 MG PO TABS
650.0000 mg | ORAL_TABLET | Freq: Once | ORAL | Status: DC
  Filled 2016-11-06: qty 2

## 2016-11-06 MED ORDER — LIDOCAINE HCL (PF) 1 % IJ SOLN
INTRAMUSCULAR | Status: AC
  Filled 2016-11-06: qty 30

## 2016-11-06 MED ORDER — IOPAMIDOL (ISOVUE-370) INJECTION 76%
INTRAVENOUS | Status: DC | PRN
  Administered 2016-11-06: 70 mL via INTRA_ARTERIAL

## 2016-11-06 MED ORDER — HEPARIN SODIUM (PORCINE) 1000 UNIT/ML IJ SOLN
INTRAMUSCULAR | Status: AC
  Filled 2016-11-06: qty 1

## 2016-11-06 MED ORDER — HEPARIN (PORCINE) IN NACL 2-0.9 UNIT/ML-% IJ SOLN
INTRAMUSCULAR | Status: AC
Start: 2016-11-06 — End: 2016-11-06
  Filled 2016-11-06: qty 1000

## 2016-11-06 MED ORDER — HEPARIN SODIUM (PORCINE) 1000 UNIT/ML IJ SOLN
INTRAMUSCULAR | Status: DC | PRN
  Administered 2016-11-06 (×2): 5000 [IU] via INTRAVENOUS

## 2016-11-06 MED ORDER — IOPAMIDOL (ISOVUE-370) INJECTION 76%
INTRAVENOUS | Status: AC
  Filled 2016-11-06: qty 100

## 2016-11-06 MED ORDER — NITROGLYCERIN IN D5W 200-5 MCG/ML-% IV SOLN: 0.0000 ug/min | INTRAVENOUS | Status: DC

## 2016-11-06 MED ORDER — NICOTINE 14 MG/24HR TD PT24: 14.0000 mg | MEDICATED_PATCH | Freq: Every day | TRANSDERMAL | Status: DC

## 2016-11-06 MED ORDER — FENTANYL CITRATE (PF) 100 MCG/2ML IJ SOLN
50.0000 ug | Freq: Once | INTRAMUSCULAR | Status: AC
  Administered 2016-11-06: 50 ug via INTRAVENOUS
  Filled 2016-11-06: qty 2

## 2016-11-06 MED ORDER — SODIUM CHLORIDE 0.9 % WEIGHT BASED INFUSION: 3.0000 mL/kg/h | INTRAVENOUS | Status: DC

## 2016-11-06 MED ORDER — HEPARIN (PORCINE) IN NACL 2-0.9 UNIT/ML-% IJ SOLN
INTRAMUSCULAR | Status: DC | PRN
  Administered 2016-11-06: 1000 mL

## 2016-11-06 MED ORDER — LIDOCAINE HCL (PF) 1 % IJ SOLN
INTRAMUSCULAR | Status: DC | PRN
  Administered 2016-11-06: 2 mL via SUBCUTANEOUS

## 2016-11-06 MED ORDER — ASPIRIN 81 MG PO CHEW
324.0000 mg | CHEWABLE_TABLET | Freq: Once | ORAL | Status: AC
  Administered 2016-11-06: 324 mg via ORAL
  Filled 2016-11-06: qty 4

## 2016-11-06 MED ORDER — SODIUM CHLORIDE 0.9% FLUSH
3.0000 mL | Freq: Two times a day (BID) | INTRAVENOUS | Status: DC
  Administered 2016-11-06: 3 mL via INTRAVENOUS

## 2016-11-06 MED ORDER — FENTANYL CITRATE (PF) 100 MCG/2ML IJ SOLN
INTRAMUSCULAR | Status: DC | PRN
  Administered 2016-11-06: 25 ug via INTRAVENOUS

## 2016-11-06 MED ORDER — VERAPAMIL HCL 2.5 MG/ML IV SOLN
INTRAVENOUS | Status: AC
  Filled 2016-11-06: qty 2

## 2016-11-06 MED ORDER — ASPIRIN EC 81 MG PO TBEC: 81.0000 mg | DELAYED_RELEASE_TABLET | Freq: Every day | ORAL | Status: DC

## 2016-11-06 MED ORDER — NITROGLYCERIN IN D5W 200-5 MCG/ML-% IV SOLN
0.0000 ug/min | Freq: Once | INTRAVENOUS | Status: AC
  Administered 2016-11-06: 5 ug/min via INTRAVENOUS
  Filled 2016-11-06: qty 250

## 2016-11-06 MED ORDER — ACETAMINOPHEN 325 MG PO TABS: 650.0000 mg | ORAL_TABLET | ORAL | Status: DC | PRN

## 2016-11-06 MED ORDER — HEPARIN (PORCINE) IN NACL 2-0.9 UNIT/ML-% IJ SOLN
INTRAMUSCULAR | Status: DC | PRN
  Administered 2016-11-06: 16:00:00 via INTRA_ARTERIAL

## 2016-11-06 MED ORDER — FENTANYL CITRATE (PF) 100 MCG/2ML IJ SOLN
INTRAMUSCULAR | Status: AC
  Filled 2016-11-06: qty 2

## 2016-11-06 MED ORDER — NITROGLYCERIN 0.4 MG SL SUBL
0.4000 mg | SUBLINGUAL_TABLET | SUBLINGUAL | Status: DC | PRN
  Administered 2016-11-06: 0.4 mg via SUBLINGUAL
  Filled 2016-11-06: qty 1

## 2016-11-06 MED ORDER — SODIUM CHLORIDE 0.9% FLUSH: 3.0000 mL | INTRAVENOUS | Status: DC | PRN

## 2016-11-06 MED ORDER — ATORVASTATIN CALCIUM 80 MG PO TABS
80.0000 mg | ORAL_TABLET | Freq: Every day | ORAL | Status: DC
  Administered 2016-11-06: 80 mg via ORAL
  Filled 2016-11-06 (×2): qty 1

## 2016-11-06 MED ORDER — HEPARIN (PORCINE) IN NACL 100-0.45 UNIT/ML-% IJ SOLN
1200.0000 [IU]/h | INTRAMUSCULAR | Status: DC
  Administered 2016-11-06: 1200 [IU]/h via INTRAVENOUS
  Filled 2016-11-06: qty 250

## 2016-11-06 SURGICAL SUPPLY — 10 items

## 2016-11-06 NOTE — Telephone Encounter (Signed)
Spoke with pt he statest that he is having chest pain and just had a heart attack(10-25-16), we do not have any appt available until Monday, pt notified to go to the ER he states that he will take a nitro and re-evaluate after taking and then if this does not work he will go to the ER

## 2016-11-06 NOTE — Progress Notes (Signed)
PA with Cards notified of pt having an increase in cp from 1/10-2/10. New orders to continue & titrated Nitro drip. VS stable & charted. Will continue to monitor the pt [facility], Colletta Maryland, RN

## 2016-11-06 NOTE — H&P (Signed)
History & Physical    Patient ID: STRYDER POITRA MRN: [id number], DOB/AGE: Sep 29, 1965   Admit date: 11/06/2016   Primary Physician: No PCP Per Patient Primary Cardiologist: [place]   Aidin H Traeger is a 51 y.o. male who is being seen today for the evaluation of chest pain at the request of Dr. Penne Lash.  Patient Profile    51 yo male with PMH of CAD s/p DES to p/mLAD (3/29), HL, and tobacco use who presented with chest pain that started around 8am this morning after he got to work.   Past Medical History   Past Medical History:  Diagnosis Date  . Acute pericarditis   . CAD (coronary artery disease)   . Chest pain   . History of tobacco abuse     Past Surgical History:  Procedure Laterality Date  . CORONARY/GRAFT ACUTE MI REVASCULARIZATION N/A 10/10/2016   Procedure: Coronary/Graft Acute MI Revascularization;  Surgeon: Alexzandria Massman M [place], MD;  Location: [facility] INVASIVE CV LAB;  Service: Cardiovascular;  Laterality: N/A;  . HAND SURGERY    . LEFT HEART CATH AND CORONARY ANGIOGRAPHY N/A 10/10/2016   Procedure: Left Heart Cath and Coronary Angiography;  Surgeon: Jacoby Ritsema M [place], MD;  Location: [facility] INVASIVE CV LAB;  Service: Cardiovascular;  Laterality: N/A;  . TOE SURGERY     left great toe     Allergies  Allergies  Allergen Reactions  . Sulfa Antibiotics Rash    History of Present Illness    Mr. Castorena is a 52 yo male with PMH of CAD s/p DES to p/mLAD (3/29), HL, and tobacco use. He initially presented on 10/10/16 with chest pain and found to have an anterior STEMI. Cardiac catheterization at that time showed occluded p/m LAD lesion treated with DES. Echo noted EF of 45-50%, mild LVH, G1DD. He was placed on DAPT with ASA, Brilinta, along with metoprolol and high dose Lipitor. Trop trended up to 32.89. He was last seen in the office on 10/25/16 by Azalee Course. EKG at that office visit showed new TWI in v2-v5 likely from evolving MI, but chest discomfort reported. Reported being compliant  with medications.  States he has been doing well since being discharged home. Has stopped smoking, working on his diet, not missed any doses of medications (including his Brilinta). He got up and went to work this morning. Once there developed right sided chest pressure. He became concerned and called the office. Was instructed to take nitro and go to the ED. States he took 1 SL nitro, with some improvement. Once in the parking lot, took a 2nd SL nitro. Then felt dizzy, and nauseated. EKG shows deepened TWI in v2-v5, with mild J point elevation in lead III. On exam he is still having chest pressure. Being started on IV heparin and nitro in the ED.   Home Medications    Prior to Admission medications   Medication Sig Start Date End Date Taking? Authorizing Provider  acetaminophen (TYLENOL) 325 MG tablet Take 2 tablets (650 mg total) by mouth every 4 (four) hours as needed for headache or mild pain. 10/13/16   Abelino Derrick, PA-C  aspirin 81 MG chewable tablet Chew 1 tablet (81 mg total) by mouth daily. 10/14/16   Abelino Derrick, PA-C  atorvastatin (LIPITOR) 80 MG tablet Take 1 tablet (80 mg total) by mouth daily at 6 PM. 10/13/16   Abelino Derrick, PA-C  metoprolol (LOPRESSOR) 50 MG tablet Take 1 tablet (50 mg total) by mouth 2 (  two) times daily. 10/13/16   Abelino Derrick, PA-C  nicotine (NICODERM CQ - DOSED IN MG/24 HOURS) 14 mg/24hr patch Place 1 patch (14 mg total) onto the skin daily. 10/14/16   Abelino Derrick, PA-C  nitroGLYCERIN (NITROSTAT) 0.4 MG SL tablet Place 1 tablet (0.4 mg total) under the tongue every 5 (five) minutes x 3 doses as needed for chest pain. 10/13/16   Abelino Derrick, PA-C  ticagrelor (BRILINTA) 90 MG TABS tablet Take 1 tablet (90 mg total) by mouth 2 (two) times daily. 10/13/16   Abelino Derrick, PA-C    Family History     Family History  Problem Relation Age of Onset  . Hypertension Mother   . Heart attack Father     <7 year old  . Heart attack Paternal Grandfather     Social  History    Social History   Social History  . Marital status: Married    Spouse name: N/A  . Number of children: N/A  . Years of education: N/A   Occupational History  . Not on file.   Social History Main Topics  . Smoking status: Current Every Day Smoker  . Smokeless tobacco: Never Used     Comment: 25 years, 1ppd  . Alcohol use Yes     Comment: former heavy alcohol use, now rarely  . 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 Sexual activity: Not on file   Other Topics Concern  . Not on file   Social History Narrative  . No narrative on file     Review of Systems    General:  No chills, fever, night sweats or weight changes.  Cardiovascular:  See HPI Dermatological: No rash, lesions/masses Respiratory: No cough, dyspnea Urologic: No hematuria, dysuria Abdominal:   No nausea, vomiting, diarrhea, bright red blood per rectum, melena, or hematemesis Neurologic:  No visual changes, wkns, changes in mental status. All other systems reviewed and are otherwise negative except as noted above.  Physical Exam    Blood pressure 115/87, pulse 60, temperature 98.7 F (37.1 C), temperature source Oral, resp. rate 16, SpO2 96 %.  General: Pleasant WNWD WM, NAD Psych: Normal affect. Neuro: Alert and oriented X 3. Moves all extremities spontaneously. HEENT: Normal  Neck: Supple without bruits or JVD. Lungs:  Resp regular and unlabored, CTA. Heart: RRR no s3, s4, or murmurs. Abdomen: Soft, non-tender, non-distended, BS + x 4.  Extremities: No clubbing, cyanosis or edema. DP/PT/Radials 2+ and equal bilaterally.  Labs    Troponin [facility] of Care Test)  Recent Labs  11/06/16 1022  TROPIPOC 0.00    Recent Labs  11/06/16 0954  TROPONINI <0.03   Lab Results  Component Value Date   WBC 7.9 11/06/2016   HGB 15.2 11/06/2016   HCT 42.8 11/06/2016   MCV 87.0 11/06/2016   PLT 196 11/06/2016    Recent Labs Lab 11/06/16 0954  NA 137  K 4.1  CL 105   CO2 23  BUN 21*  CREATININE 1.29*  CALCIUM 9.0  PROT 6.7  BILITOT 0.7  ALKPHOS 98  ALT 43  AST 40  GLUCOSE 117*   Lab Results  Component Value Date   CHOL 187 10/11/2016   HDL 33 (L) 10/11/2016   LDLCALC 124 (H) 10/11/2016   TRIG 152 (H) 10/11/2016   No results found for: [facility]   Radiology Studies    Dg Chest [facility] 1 View  Result  Date: 10/10/2016 CLINICAL DATA:  ST-elevation myocardial infarction, acute onset. Initial encounter. EXAM: PORTABLE CHEST 1 VIEW COMPARISON:  None. FINDINGS: The lungs are well-aerated and clear. There is no evidence of focal opacification, pleural effusion or pneumothorax. The cardiomediastinal silhouette is within normal limits. No acute osseous abnormalities are seen. IMPRESSION: No acute cardiopulmonary process seen. Electronically Signed   By: Roanna Raider M.D.   On: 10/10/2016 23:00    ECG & Cardiac Imaging    LHC: 10/10/16  Conclusion     There is mild left ventricular systolic dysfunction.  The left ventricular ejection fraction is 45-50% by visual estimate.  LV end diastolic pressure is normal.  A STENT PROMUS PREM MR 3.5X20 drug eluting stent was successfully placed.  Prox LAD to Mid LAD lesion, 100 %stenosed.  Post intervention, there is a 0% residual stenosis.   1. 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 Mild LV dysfunction 3. Normal LVEDP 4. Successful stenting of the LAD with DES  Plan: DAPT for one year. Smoking cessation. Statin therapy. Beta blocker.    Assessment & Plan    51 yo male with PMH of CAD s/p DES to p/mLAD (3/29), HL, and tobacco use who presented with chest pain that started around 8am this morning after he got to work.   1. Chest pain: Recent p/m LAD stent 3/29. Has been doing very well since DC. Once he got to work today developed right sided chest pressure, improved with SL nitro. EKG shows deeper TWI in v2-v5 with mild joint elevation in lead III. Continues to have chest  pressure. With recent stent placement this is concerning for thrombus. -- relook cardiac cath. The patient understands that risks included but are not limited to stroke (1 in 1000), death (1 in 1000), kidney failure [usually temporary] (1 in 500), bleeding (1 in 200), allergic reaction [possibly serious] (1 in 200).  -- IV nitro, IV heparin -- ASA, BB, Statin, Brilinta  2. HL: On high dose Lipitor  3. Hx of tobacco Use: Reports he has now stopped smoking.   Janice Coffin, NP-C Pager [phone] 11/06/2016, 10:50 AM Patient seen and examined and history reviewed. Agree with above findings and plan. 51 yo WM well known to me from prior hospitalization. He is s/p DES of the mid LAD for anterior STEMI in March. His troponin increased to the 30s. Ecg in hospital post procedure looked normal. On follow up he was noted to anterior T wave inversion but was asymptomatic and this was felt to represent evolutionary changes. Today he developed acute chest pressure. Did not feel right. Took sl Ntg with relief but then pain recurred. Took a second Ntg and felt dizzy. Got a third Ntg in ED and now is pain free. Not as intense as prior MI. He has been very compliant with lifestyle changes and medications.  On exam he is in NAD Multiple tatoos. No JVD or bruits. Lungs are clear CV RRR without gallop or murmur No edema. Pulses 2+  Ecg shows NSR with more pronounced anterolateral T wave inversion. Initial troponin is normal  Impression: Recurrent chest pain in patient with recent anterior STEMI and DES of LAD last month. Ecg changes are concerning. Unclear if these changes are due to ACS or are evolving changes from his prior MI. Given symptoms I have recommended relook cardiac cath today. Other means of evaluation such as stress testing discussed but I don't think would be very helpful with abnormal Ecg and  recent infarct. Patient understands and is agreeable to proceed. Currently on IV Ntg. Will  start IV heparin and place on cath schedule today.  Kresta Templeman [place], MDFACC 11/06/2016 11:27 AM

## 2016-11-06 NOTE — Progress Notes (Signed)
Pt's IVs as well as TR band were removed. Pt tolerated it fine. Slight tenderness & swelling at site but no hematoma noted. VS stable & charted. Education was given on site care, follow-up appointments, when to call the MD, diet & etc. Education verified via teach back. All the the pt's belongings were sent with him. CCMD notified of discharge & telemetry box was discontinued. Sanda Linger, RN

## 2016-11-06 NOTE — Discharge Summary (Signed)
Discharge Summary    Patient ID: Travis Levine,  MRN: [id number], DOB/AGE: 08-17-1965 51 y.o.  Admit date: 11/06/2016 Discharge date: 11/06/2016  Primary Care Provider: No PCP Per Patient Primary Cardiologist: Dr. Swaziland   Discharge Diagnoses    Principal Problem:   Chest pain Active Problems:   History of tobacco abuse   CAD S/P percutaneous coronary angioplasty   AKI (acute kidney injury) (HCC)   Ischemic cardiomyopathy   Hyperlipidemia   Allergies Allergies  Allergen Reactions  . Sulfa Antibiotics Rash    Diagnostic Studies/Procedures    Cath 11/06/2016 Conclusion   1. Widely patent LAD stent without restenosis 2. Minor luminal irregularities without significant stenosis 3. Normal LV function with mildly elevated LVEDP  Suspect noncardiac symptoms. Continue current Rx.    _____________   History of Present Illness     Travis Levine is a 51 yo male with PMH of CAD s/p DES to p/mLAD (3/29), HL, and tobacco use. He initially presented on 10/10/16 with chest pain and found to have an anterior STEMI. Cardiac catheterization at that time showed occluded p/m LAD lesion treated with DES. Echo noted EF of 45-50%, mild LVH, G1DD. He was placed on DAPT with ASA, Brilinta, along with metoprolol and high dose Lipitor. Trop trended up to 32.89. He was last seen in the office on 10/25/16 by Azalee Course. EKG at that office visit showed new TWI in v2-v5 likely from evolving MI, but chest discomfort reported. Reported being compliant with medications.  States he has been doing well since being discharged home. Has stopped smoking, working on his diet, not missed any doses of medications (including his Brilinta). He got up and went to work this morning. Once there developed right sided chest pressure. He became concerned and called the office. Was instructed to take nitro and go to the ED. States he took 1 SL nitro, with some improvement. Once in the parking lot, took a 2nd SL nitro.  Then felt dizzy, and nauseated. EKG shows deepened TWI in v2-v5, with mild J point elevation in lead III. On exam he is still having chest pressure. Being started on IV heparin and nitro in the ED.   Hospital Course     On arrival, patient had deep T wave inversion in V2 through V5, mild J-point elevation in lead 3. Due to persistent chest pain, eventually it was decided to pursue cardiac catheterization. He underwent cardiac catheterization on the same day. Cath obtained on 11/06/2016, it showed widely patent LAD stent without any sign of restenosis, minor luminal irregularities without significant stenosis, normal LV function with mildly elevated LVEDP. He is deemed stable for discharge from cardiology perspective.  _____________  Discharge Vitals Blood pressure 111/80, pulse 61, temperature 98 F (36.7 C), temperature source Oral, resp. rate 10, height  (1.727 m), weight 195 lb 1.6 oz (88.5 kg), SpO2 100 %.  Filed Weights   11/06/16 1337  Weight: 195 lb 1.6 oz (88.5 kg)    Labs & Radiologic Studies    CBC  Recent Labs  11/06/16 0954  WBC 7.9  NEUTROABS 4.7  HGB 15.2  HCT 42.8  MCV 87.0  PLT 196   Basic Metabolic Panel  Recent Labs  11/06/16 0954  NA 137  K 4.1  CL 105  CO2 23  GLUCOSE 117*  BUN 21*  CREATININE 1.29*  CALCIUM 9.0   Liver Function Tests  Recent Labs  11/06/16 0954  AST 40  ALT 43  ALKPHOS 98  BILITOT 0.7  PROT 6.7  ALBUMIN 4.1   No results for input(s): LIPASE, AMYLASE in the last 72 hours. Cardiac Enzymes  Recent Labs  11/06/16 0954 11/06/16 1406  TROPONINI <0.03 <0.03   _____________  Dg Chest Port 1 View  Result Date: 10/10/2016 CLINICAL DATA:  ST-elevation myocardial infarction, acute onset. Initial encounter. EXAM: PORTABLE CHEST 1 VIEW COMPARISON:  None. FINDINGS: The lungs are well-aerated and clear. There is no evidence of focal opacification, pleural effusion or pneumothorax. The cardiomediastinal silhouette is within  normal limits. No acute osseous abnormalities are seen. IMPRESSION: No acute cardiopulmonary process seen. Electronically Signed   By: Roanna Raider M.D.   On: 10/10/2016 23:00   Disposition   Pt is being discharged home today in good condition.  Follow-up Plans & Appointments    Follow-up Information    Peter Swaziland, MD Follow up on 01/14/2017.   Specialty:  Cardiology Why:  @ 8AM. Cardiology followup Contact information: [address] [place] [address] [phone]          Discharge Instructions    Diet - low sodium heart healthy    Complete by:  As directed    Discharge instructions    Complete by:  As directed    No lifting over 5 lbs for 1 week. No sexual activity for 1 week. You may return to work on 11/07/2016. Keep procedure site clean & dry. If you notice increased pain, swelling, bleeding or pus, call/return!  You may shower, but no soaking baths/hot tubs/pools for 1 week.   Increase activity slowly    Complete by:  As directed       Discharge Medications   Current Discharge Medication List    CONTINUE these medications which have NOT CHANGED   Details  acetaminophen (TYLENOL) 325 MG tablet Take 2 tablets (650 mg total) by mouth every 4 (four) hours as needed for headache or mild pain.    aspirin 81 MG chewable tablet Chew 1 tablet (81 mg total) by mouth daily.    atorvastatin (LIPITOR) 80 MG tablet Take 1 tablet (80 mg total) by mouth daily at 6 PM. Qty: 90 tablet, Refills: 3    metoprolol (LOPRESSOR) 50 MG tablet Take 1 tablet (50 mg total) by mouth 2 (two) times daily. Qty: 180 tablet, Refills: 3    nicotine (NICODERM CQ - DOSED IN MG/24 HOURS) 14 mg/24hr patch Place 1 patch (14 mg total) onto the skin daily. Qty: 28 patch, Refills: 0    nitroGLYCERIN (NITROSTAT) 0.4 MG SL tablet Place 1 tablet (0.4 mg total) under the tongue every 5 (five) minutes x 3 doses as needed for chest pain. Qty: 25 tablet, Refills: 2    ticagrelor  (BRILINTA) 90 MG TABS tablet Take 1 tablet (90 mg total) by mouth 2 (two) times daily. Qty: 180 tablet, Refills: 3          Outstanding Labs/Studies   None  Duration of Discharge Encounter   Greater than 30 minutes including physician time.  Signed, Azalee Course PA-C 11/06/2016, 5:09 PM

## 2016-11-06 NOTE — ED Provider Notes (Addendum)
[facility]-EMERGENCY DEPT Provider Note   CSN: [id number] Arrival date & time: 11/06/16  4098     History   Chief Complaint Chief Complaint  Patient presents with  . Chest Pain    HPI Travis Levine is a 51 y.o. male.  HPI   51 yo M with PMHx of CAD, with recent STEMI s/p DES to mLAD on 10/10/16, here with chest pressure. Pt states he awoke this AM with dull, aching, substernal chest pressure. He had associated mild diaphoresis. He took a ntiroglycerin with improvement, but the pain then returned approx 20 min later. Over the next hour, his pain has returned and again intermittently relieved with nitro. He called the cards office who advised ED evaluation. He has been taking his meds as prescribed. He states his pain isn't as severe as his heart attack but the diaphoresis is new. He has 1/10 chest pressure right now but no SOB. No lightheadedness. He has been compliant with his meds but did not take his Brilinta today.  Past Medical History:  Diagnosis Date  . Acute pericarditis   . CAD (coronary artery disease)   . Chest pain   . History of tobacco abuse     Patient Active Problem List   Diagnosis Date Noted  . Chest pain 11/06/2016  . Unstable angina (HCC)   . Coronary artery disease involving native coronary artery of native heart with unstable angina pectoris (HCC)   . History of ST elevation myocardial infarction (STEMI)   . Hyperlipidemia 10/26/2016  . CAD S/P percutaneous coronary angioplasty 10/13/2016  . AKI (acute kidney injury) (HCC) 10/13/2016  . Ischemic cardiomyopathy 10/13/2016  . Dyslipidemia 10/12/2016  . STEMI involving left anterior descending coronary artery (HCC) 10/10/2016  . History of tobacco abuse     Past Surgical History:  Procedure Laterality Date  . CORONARY/GRAFT ACUTE MI REVASCULARIZATION N/A 10/10/2016   Procedure: Coronary/Graft Acute MI Revascularization;  Surgeon: Peter M Swaziland, MD;  Location: [facility] INVASIVE CV LAB;  Service: Cardiovascular;   Laterality: N/A;  . HAND SURGERY    . LEFT HEART CATH AND CORONARY ANGIOGRAPHY N/A 10/10/2016   Procedure: Left Heart Cath and Coronary Angiography;  Surgeon: Peter M Swaziland, MD;  Location: [facility] INVASIVE CV LAB;  Service: Cardiovascular;  Laterality: N/A;  . TOE SURGERY     left great toe       Home Medications    Prior to Admission medications   Medication Sig Start Date End Date Taking? Authorizing Provider  acetaminophen (TYLENOL) 325 MG tablet Take 2 tablets (650 mg total) by mouth every 4 (four) hours as needed for headache or mild pain. 10/13/16  Yes Abelino Derrick, PA-C  aspirin 81 MG chewable tablet Chew 1 tablet (81 mg total) by mouth daily. 10/14/16  Yes Luke K Kilroy, PA-C  atorvastatin (LIPITOR) 80 MG tablet Take 1 tablet (80 mg total) by mouth daily at 6 PM. 10/13/16  Yes Abelino Derrick, PA-C  metoprolol (LOPRESSOR) 50 MG tablet Take 1 tablet (50 mg total) by mouth 2 (two) times daily. 10/13/16  Yes Luke K Kilroy, PA-C  nicotine (NICODERM CQ - DOSED IN MG/24 HOURS) 14 mg/24hr patch Place 1 patch (14 mg total) onto the skin daily. 10/14/16  Yes Luke K Kilroy, PA-C  nitroGLYCERIN (NITROSTAT) 0.4 MG SL tablet Place 1 tablet (0.4 mg total) under the tongue every 5 (five) minutes x 3 doses as needed for chest pain. 10/13/16  Yes Abelino Derrick, PA-C  ticagrelor (BRILINTA) 90  MG TABS tablet Take 1 tablet (90 mg total) by mouth 2 (two) times daily. 10/13/16  Yes Abelino Derrick, PA-C    Family History Family History  Problem Relation Age of Onset  . Hypertension Mother   . Heart attack Father     <70 year old  . Heart attack Paternal Grandfather     Social History Social History  Substance Use Topics  . Smoking status: Current Every Day Smoker  . Smokeless tobacco: Never Used     Comment: 25 years, 1ppd  . Alcohol use Yes     Comment: former heavy alcohol use, now rarely     Allergies   Sulfa antibiotics   Review of Systems Review of Systems  Constitutional: Positive for fatigue.  Negative for chills and fever.  HENT: Negative for congestion and rhinorrhea.   Eyes: Negative for visual disturbance.  Respiratory: Positive for chest tightness and shortness of breath. Negative for cough and wheezing.   Cardiovascular: Positive for chest pain. Negative for leg swelling.  Gastrointestinal: Negative for abdominal pain, diarrhea, nausea and vomiting.  Genitourinary: Negative for dysuria and flank pain.  Musculoskeletal: Negative for neck pain and neck stiffness.  Skin: Negative for rash and wound.  Allergic/Immunologic: Negative for immunocompromised state.  Neurological: Negative for syncope, weakness and headaches.  All other systems reviewed and are negative.    Physical Exam Updated Vital Signs BP 108/77   Pulse 61   Temp 98.3 F (36.8 C) (Oral)   Resp 10   Ht  (1.727 m)   Wt 195 lb 1.6 oz (88.5 kg)   SpO2 96%   BMI 29.66 kg/m   Physical Exam  Constitutional: He is oriented to person, place, and time. He appears well-developed and well-nourished. No distress.  HENT:  Head: Normocephalic and atraumatic.  Eyes: Conjunctivae are normal.  Neck: Neck supple.  Cardiovascular: Normal rate, regular rhythm and normal heart sounds.  Exam reveals no friction rub.   No murmur heard. Pulmonary/Chest: Effort normal and breath sounds normal. No respiratory distress. He has no wheezes. He has no rales.  Abdominal: He exhibits no distension.  Musculoskeletal: He exhibits no edema.  Neurological: He is alert and oriented to person, place, and time. He exhibits normal muscle tone.  Skin: Skin is warm. Capillary refill takes less than 2 seconds.  Psychiatric: He has a normal mood and affect.  Nursing note and vitals reviewed.    ED Treatments / Results  Labs (all labs ordered are listed, but only abnormal results are displayed) Labs Reviewed  COMPREHENSIVE METABOLIC PANEL - Abnormal; Notable for the following:       Result Value   Glucose, Bld 117 (*)    BUN  21 (*)    Creatinine, Ser 1.29 (*)    All other components within normal limits  BRAIN NATRIURETIC PEPTIDE - Abnormal; Notable for the following:    B Natriuretic Peptide 277.3 (*)    All other components within normal limits  CBC WITH DIFFERENTIAL/PLATELET  TROPONIN I  TROPONIN I  PROTIME-INR  TROPONIN I  TROPONIN I  BASIC METABOLIC PANEL  I-STAT TROPOININ, ED    EKG  EKG Interpretation  Date/Time:  Wednesday November 06 2016 09:28:58 EDT Ventricular Rate:  73 PR Interval:    QRS Duration: 101 QT Interval:  412 QTC Calculation: 454 R Axis:   94 Text Interpretation:  Sinus rhythm Borderline right axis deviation Abnrm T, consider ischemia, anterolateral lds Since last EKG, there are new, diffuse TWI  in the anterolateral leads Confirmed by [facility] MD, Sheria Lang [phone]) on 11/06/2016 9:32:15 AM       Radiology No results found.  Procedures Procedures (including critical care time)  Medications Ordered in ED Medications  acetaminophen (TYLENOL) tablet 650 mg ( Oral MAR Unhold 11/06/16 1646)  atorvastatin (LIPITOR) tablet 80 mg (80 mg Oral Given 11/06/16 1710)  metoprolol tartrate (LOPRESSOR) tablet 50 mg (50 mg Oral Given 11/06/16 1704)  nicotine (NICODERM CQ - dosed in mg/24 hours) patch 14 mg (14 mg Transdermal Not Given 11/06/16 1650)  ticagrelor (BRILINTA) tablet 90 mg (90 mg Oral Given 11/06/16 1704)  aspirin EC tablet 81 mg ( Oral MAR Unhold 11/06/16 1646)  acetaminophen (TYLENOL) tablet 650 mg ( Oral MAR Unhold 11/06/16 1646)  ondansetron (ZOFRAN) injection 4 mg ( Intravenous MAR Unhold 11/06/16 1646)  0.9% sodium chloride infusion (1 mL/kg/hr  88.5 kg Intravenous Rate/Dose Change 11/06/16 1651)  sodium chloride flush (NS) 0.9 % injection 3 mL (not administered)  sodium chloride flush (NS) 0.9 % injection 3 mL (not administered)  0.9 %  sodium chloride infusion (not administered)  aspirin chewable tablet 324 mg (324 mg Oral Given 11/06/16 0945)  fentaNYL (SUBLIMAZE) injection  50 mcg (50 mcg Intravenous Given 11/06/16 1001)  nitroGLYCERIN 50 mg in dextrose 5 % 250 mL (0.2 mg/mL) infusion (5 mcg/min Intravenous New Bag/Given 11/06/16 1052)  heparin bolus via infusion 4,000 Units (4,000 Units Intravenous Bolus from Bag 11/06/16 1320)     Initial Impression / Assessment and Plan / ED Course  I have reviewed the triage vital signs and the nursing notes.  Pertinent labs & imaging results that were available during my care of the patient were reviewed by me and considered in my medical decision making (see chart for details).     51 yo M with h/o CAD s/p recent PCI with DES to mLAD here with ongoing CP. EKG concerning for new precordial deep, symmetric TWI. ASA, nitro given. Cardiology paged. No ST elevations on initial and repeat EKG but c/f possible in-stent thrombosis or recurrent ACS. Cardiology quickly at bedside, placed on nitro gtt, heparin and will take to cath lab. No STEMI at this time but concerning history.   Final Clinical Impressions(s) / ED Diagnoses   Final diagnoses:  Chest pain with high risk for cardiac etiology  History of ST elevation myocardial infarction (STEMI)    New Prescriptions Current Discharge Medication List       Shaune Pollack, MD 11/06/16 1933    Shaune Pollack, MD 11/06/16 [phone]

## 2016-11-06 NOTE — Plan of Care (Signed)
Problem: Physical Regulation: Goal: Ability to maintain clinical measurements within normal limits will improve Outcome: Completed/Met Date Met: 11/06/16 Pt is aware of the importance of hand hygiene & infection prevention.   Problem: Activity: Goal: Risk for activity intolerance will decrease Outcome: Completed/Met Date Met: 11/06/16 Pt was education on restrictions on sexual activity, driving, working & lifting d/t procedure.   Problem: Health Behavior/Discharge Planning: Goal: Ability to safely manage health-related needs after discharge will improve Outcome: Completed/Met Date Met: 11/06/16 Pt is very aware of his healthcare & was able to discuss In depth with  RN on his diet, medications, follow-up appointments, when to call the MD as well as his site care.

## 2016-11-06 NOTE — Plan of Care (Signed)
Problem: Education: Goal: Knowledge of [facility] Education information/materials will improve Outcome: Completed/Met Date Met: 11/06/16 Pt quit smoking after his last admission. Pt's education on site care, diet, when to call Md, meds & etc were all verified via teach back at discharge. Pt followed all the proper steps today & is taking all the proper steps in the right direction.

## 2016-11-06 NOTE — Progress Notes (Signed)
ANTICOAGULATION CONSULT NOTE - Initial Consult  Pharmacy Consult for Heparin Indication: chest pain/ACS  Allergies  Allergen Reactions  . Sulfa Antibiotics Rash    Patient Measurements:   Heparin Dosing Weight: 87.5 kg  Vital Signs: Temp: 98.7 F (37.1 C) (04/25 0930) Temp Source: Oral (04/25 0930) BP: 116/80 (04/25 1244) Pulse Rate: 66 (04/25 1244)  Labs:  Recent Labs  11/06/16 0954  HGB 15.2  HCT 42.8  PLT 196  CREATININE 1.29*  TROPONINI <0.03    Estimated Creatinine Clearance: 72.6 mL/min (A) (by C-G formula based on SCr of 1.29 mg/dL (H)).   Medical History: Past Medical History:  Diagnosis Date  . Acute pericarditis   . CAD (coronary artery disease)   . Chest pain   . History of tobacco abuse     Medications:   (Not in a hospital admission) Scheduled:  . acetaminophen  650 mg Oral Once  . [START ON 11/07/2016] aspirin EC  81 mg Oral Daily  . atorvastatin  80 mg Oral q1800  . metoprolol  50 mg Oral BID  . nicotine  14 mg Transdermal Daily  . sodium chloride flush  3 mL Intravenous Q12H  . ticagrelor  90 mg Oral BID   Infusions:  . sodium chloride    . [START ON 11/07/2016] sodium chloride     Followed by  . [START ON 11/07/2016] sodium chloride      Assessment: 51yo with history of CAD s/p DES to p/mLAD, HLD and tobacco abuse presents with CP. Pharmacy is consulted to dose heparin for ACS/chest pain.   Goal of Therapy:  Heparin level 0.3-0.7 units/ml Monitor platelets by anticoagulation protocol: Yes   Plan:  Give 4000 units bolus x 1 Start heparin infusion at 1200 units/hr Check anti-Xa level in 6 hours and daily while on heparin Continue to monitor H&H and platelets  Arlean Hopping. Newman Pies, PharmD, BCPS Clinical Pharmacist [phone] 11/06/2016,12:50 PM

## 2016-11-06 NOTE — ED Notes (Signed)
Cardiology at bedside.

## 2016-11-06 NOTE — Telephone Encounter (Signed)
S/w pt, pt is in the hospital at this moment.... KJ

## 2016-11-06 NOTE — ED Triage Notes (Signed)
Pt reports MI 3 weeks ago, states central chest pressure that began this am while he was at work, took 1 sl nitro with some relief then pain returned, took a 2nd sl nitro with some relief as well. Pt states his cardiologist advised him to come to ed for further evaluation. Pt a/ox4, resp e/u, nad.

## 2016-11-06 NOTE — ED Notes (Signed)
Consent signed for cath lab. 

## 2016-11-06 NOTE — Telephone Encounter (Signed)
Patient calling, states that he has been experiencing chest pain for a couple of days. Patient states that today the pain is continuous and currently has the chest pain in the middle of his chest. Thanks.

## 2016-11-06 NOTE — Care Management Note (Signed)
Case Management Note  Patient Details  Name: Travis Levine MRN: [id number] Date of Birth: 1966-03-02  Subjective/Objective:                  51 yo male from home with PMH of CAD s/p DES to p/mLAD (3/29), HL, and tobacco use. He initially presented on 10/10/16 with chest pain and found to have an anterior STEMI.  Action/Plan: Follow for disposition needs.    Expected Discharge Date:                  Expected Discharge Plan:  Home/Self Care  In-House Referral:  NA  Discharge planning Services  CM Consult  Post Acute Care Choice:    Choice offered to:     DME Arranged:    DME Agency:     HH Arranged:    HH Agency:     Status of Service:  In process, will continue to follow  If discussed at Long Length of Stay Meetings, dates discussed:    Additional Comments: Pt received Brilinta savings card 10/13/16.  Oletta Cohn, RN 11/06/2016, 11:44 AM

## 2016-11-06 NOTE — Interval H&P Note (Signed)
History and Physical Interval Note:  11/06/2016 4:09 PM  Travis Levine  has presented today for surgery, with the diagnosis of cp  The various methods of treatment have been discussed with the patient and family. After consideration of risks, benefits and other options for treatment, the patient has consented to  Procedure(s): Left Heart Cath and Coronary Angiography (N/A) as a surgical intervention .  The patient's history has been reviewed, patient examined, no change in status, stable for surgery.  I have reviewed the patient's chart and labs.  Questions were answered to the patient's satisfaction.     Tonny Bollman

## 2016-11-07 ENCOUNTER — Encounter (HOSPITAL_COMMUNITY): Payer: Self-pay | Admitting: Cardiovascular Disease

## 2016-11-08 ENCOUNTER — Telehealth: Payer: Self-pay | Admitting: Cardiology

## 2016-11-08 NOTE — Telephone Encounter (Signed)
Letter to return to work Mon 11/11/16 faxed to fax # [phone].

## 2016-11-08 NOTE — Telephone Encounter (Signed)
New message     Pt needs a medical release for him to return back to work on 11/11/16 ,please call when ready he will get a fax number for you to fax it to his job and fax to [employer]

## 2016-11-08 NOTE — Telephone Encounter (Signed)
OK to give him release to return to work Monday  Loriann Bosserman Sw[place] MD, [facility]

## 2016-11-08 NOTE — Telephone Encounter (Signed)
Please fax his release to go back to work to [phone].

## 2016-11-08 NOTE — Telephone Encounter (Signed)
Spoke to patient advised letter will not fax to fax # [phone].Stated he will come to [facility] office to pick up.

## 2016-11-12 ENCOUNTER — Telehealth (HOSPITAL_COMMUNITY): Payer: Self-pay | Admitting: General Practice

## 2016-11-29 ENCOUNTER — Encounter (HOSPITAL_COMMUNITY): Payer: Self-pay | Admitting: General Practice

## 2016-11-29 NOTE — Progress Notes (Signed)
Mailed letter with our CRP to pt... KJ °

## 2016-12-13 ENCOUNTER — Telehealth: Payer: Self-pay | Admitting: Cardiology

## 2016-12-13 NOTE — Telephone Encounter (Signed)
New message    Pt is calling. He says that he knows he will bruise being on blood thinners. He said he has a knot on the side of his calf that is very painful but no bruise and it is getting better. He said he is concerned because it hurts.

## 2016-12-13 NOTE — Telephone Encounter (Signed)
Spoke with pt, he has a place on the inside of his calf that is swollen, about the size of an egg. It has gotten larger as the day has gone on. There is no bruising, increased warmth or streaks on the leg. He reports it hurts when he touches it. He reports feeling the best he has in a decade. Will discuss with dr Swazilandjordan.

## 2016-12-13 NOTE — Telephone Encounter (Signed)
Discussed with dr Swazilandjordan, not sure what that is but do not feel it is clot related. Patient instructed to keep leg elevated and to go [facility] urgent care or medical doctor is worsens over the weekend. Pt agreed with this plan.

## 2017-01-12 NOTE — Progress Notes (Signed)
Cardiology Office Note   Date:  01/14/2017   ID:  Travis Levine, DOB Mar 11, 1966, MRN [id number]  PCP:  Patient, No Pcp Per  Cardiologist:   Peter Swaziland, MD   Chief Complaint  Patient presents with  . Follow-up    12 months      History of Present Illness: Travis Levine is a 51 y.o. male who presents for follow up CAD. He was admitted to the hospital on 10/10/2016 with an  anterior STEMI. Cardiac catheterization performed on 10/10/2016 showed EF 45-50%, percent occluded proximal to mid LAD lesion treated with Promus Premier 3.5 x 20 mm DES. Troponin peaked at 33. Echocardiogram obtained on 10/11/2016 showed EF 45-50%, mild LVH, grade 1 diastolic dysfunction. Post procedure, patient was started on aspirin, Brilinta, metoprolol and high-dose Lipitor. He has a history of tobacco abuse.   He was readmitted on 11/06/16 after experiencing right sided chest pain. Some dizziness after taking second Ntg. He ruled out for MI. Ecg showed deeper T wave inversion in precordial leads. He had repeat cardiac cath showing no obstructive CAD and widely patent stent. Normal LV function and EDP.   On follow up today he reports feeling very well. Has occasional mild chest pain that he thinks is more musculoskeletal- he does a lot of lifting at work. He has quit smoking. He is eating a much healthier diet. He feels much better. He is planning to move to the [facility] for a new job opportunity.     Past Medical History:  Diagnosis Date  . Acute pericarditis   . CAD (coronary artery disease)   . Chest pain   . History of tobacco abuse     Past Surgical History:  Procedure Laterality Date  . CORONARY/GRAFT ACUTE MI REVASCULARIZATION N/A 10/10/2016   Procedure: Coronary/Graft Acute MI Revascularization;  Surgeon: Peter M Swaziland, MD;  Location: [facility] INVASIVE CV LAB;  Service: Cardiovascular;  Laterality: N/A;  . HAND SURGERY    . LEFT HEART CATH AND CORONARY ANGIOGRAPHY N/A 10/10/2016   Procedure: Left Heart Cath and Coronary Angiography;  Surgeon: Peter M Swaziland, MD;  Location: [facility] INVASIVE CV LAB;  Service: Cardiovascular;  Laterality: N/A;  . LEFT HEART CATH AND CORONARY ANGIOGRAPHY N/A 11/06/2016   Procedure: Left Heart Cath and Coronary Angiography;  Surgeon: Tonny Bollman, MD;  Location: [facility] INVASIVE CV LAB;  Service: Cardiovascular;  Laterality: N/A;  . TOE SURGERY     left great toe     Current Outpatient Prescriptions  Medication Sig Dispense Refill  . acetaminophen (TYLENOL) 325 MG tablet Take 2 tablets (650 mg total) by mouth every 4 (four) hours as needed for headache or mild pain.    Marland Kitchen aspirin 81 MG chewable tablet Chew 1 tablet (81 mg total) by mouth daily.    Marland Kitchen atorvastatin (LIPITOR) 80 MG tablet Take 1 tablet (80 mg total) by mouth daily at 6 PM. 90 tablet 3  . metoprolol (LOPRESSOR) 50 MG tablet Take 1 tablet (50 mg total) by mouth 2 (two) times daily. 180 tablet 3  . nitroGLYCERIN (NITROSTAT) 0.4 MG SL tablet Place 1 tablet (0.4 mg total) under the tongue every 5 (five) minutes x 3 doses as needed for chest pain. 25 tablet 2  . ticagrelor (BRILINTA) 90 MG TABS tablet Take 1 tablet (90 mg total) by mouth 2 (two) times daily. 180 tablet 3   No current facility-administered medications for this visit.     Allergies:   Sulfa antibiotics  Social History:  The patient  reports that he has quit smoking.  He has never used smokeless tobacco. He reports that he drinks alcohol. He reports that he does not use drugs.   Family History:  The patient's family history includes Heart attack in his father and paternal grandfather; Hypertension in his mother.    ROS:  Please see the history of present illness.   Otherwise, review of systems are positive for none.   All other systems are reviewed and negative.    PHYSICAL EXAM: VS:  BP 110/78   Pulse 70   Ht 5\' 8"  (1.727 m)   Wt 204 lb (92.5 kg)   BMI 31.02 kg/m  , BMI Body mass index is 31.02 kg/m. GEN: Well  nourished, well developed, in no acute distress  HEENT: normal  Neck: no JVD, carotid bruits, or masses Cardiac: RRR; no murmurs, rubs, or gallops,no edema  Respiratory:  clear to auscultation bilaterally, normal work of breathing GI: soft, nontender, nondistended, + BS MS: no deformity or atrophy  Skin: warm and dry, no rash Neuro:  Strength and sensation are intact Psych: euthymic mood, full affect   EKG:  EKG is not ordered today. The ekg ordered today demonstrates N/A   Recent Labs: 11/06/2016: ALT 43; B Natriuretic Peptide 277.3; BUN 21; Creatinine, Ser 1.29; Hemoglobin 15.2; Platelets 196; Potassium 4.1; Sodium 137    Lipid Panel    Component Value Date/Time   CHOL 187 10/11/2016 0339   TRIG 152 (H) 10/11/2016 0339   HDL 33 (L) 10/11/2016 0339   CHOLHDL 5.7 10/11/2016 0339   VLDL 30 10/11/2016 0339   LDLCALC 124 (H) 10/11/2016 0339      Wt Readings from Last 3 Encounters:  01/14/17 204 lb (92.5 kg)  11/06/16 195 lb 1.6 oz (88.5 kg)  10/25/16 196 lb 12.8 oz (89.3 kg)      Other studies Reviewed: Cardiac cath 11/06/16: Procedures   Left Heart Cath and Coronary Angiography  Conclusion   1. Widely patent LAD stent without restenosis 2. Minor luminal irregularities without significant stenosis 3. Normal LV function with mildly elevated LVEDP  Suspect noncardiac symptoms. Continue current Rx.    Echo: 10/11/16: Study Conclusions  - Left ventricle: The cavity size was normal. There was mild   concentric hypertrophy. Systolic function was mildly reduced. The   estimated ejection fraction was in the range of 45% to 50%. Wall   motion was normal; there were no regional wall motion   abnormalities. Doppler parameters are consistent with abnormal   left ventricular relaxation (grade 1 diastolic dysfunction).   Doppler parameters are consistent with elevated ventricular   end-diastolic filling pressure. - Aortic valve: Trileaflet; normal thickness leaflets. There  was no   regurgitation. - Aortic root: The aortic root was normal in size. - Mitral valve: Structurally normal valve. - Right ventricle: Systolic function was normal. - Tricuspid valve: There was trivial regurgitation. - Pulmonary arteries: Systolic pressure was within the normal   range. - Inferior vena cava: The vessel was normal in size. - Pericardium, extracardiac: There was no pericardial effusion.  Impressions:  - There is akinesis of the mid anteroseptal, apical septal,   anterior walls and of the true apex. LVEF 45-50%. No thrombus in   the LV apex on Definity images.  Cardiac cath 10/10/16: Procedures   Coronary/Graft Acute MI Revascularization  Left Heart Cath and Coronary Angiography  Conclusion     There is mild left ventricular systolic dysfunction.  The  left ventricular ejection fraction is 45-50% by visual estimate.  LV end diastolic pressure is normal.  A STENT PROMUS PREM MR [id number] drug eluting stent was successfully placed.  Prox LAD to Mid LAD lesion, 100 %stenosed.  Post intervention, there is a 0% residual stenosis.   1. 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 Mild LV dysfunction 3. Normal LVEDP 4. Successful stenting of the LAD with DES  Plan: DAPT for one year. Smoking cessation. Statin therapy. Beta blocker.       ASSESSMENT AND PLAN:  1.  CAD s/p anterior STEMI at the end of March. S/p DES of mid LAD. No other obstructive disease. Repeat cath OK. No clear anginal symptoms. Will continue DAPT for one year then stop Brilinta. Continue metoprolol. 2. Mild LV dysfunction. EF 45-50%.  3. Hypercholesterolemia. On high dose lipitor. Will check fasting labs today. Encourage long term statin use. 4. Tobacco abuse. Now stopped.   Current medicines are reviewed at length with the patient today.  The patient does not have concerns regarding medicines.  The following changes have been made:  no change  Labs/ tests ordered  today include:   Orders Placed This Encounter  Procedures  . Basic metabolic panel  . Lipid panel  . Hepatic function panel     Disposition:   FU with me in 6 months. If he does move to the [place] will need to get established with primary care and cardiologist there.   Signed, Peter Swaziland, MD  01/14/2017 8:26 AM    [facility] [address] [place], [place], [address] Phone [phone], Fax [phone]

## 2017-01-14 ENCOUNTER — Encounter (INDEPENDENT_AMBULATORY_CARE_PROVIDER_SITE_OTHER): Payer: Self-pay

## 2017-01-14 ENCOUNTER — Ambulatory Visit (INDEPENDENT_AMBULATORY_CARE_PROVIDER_SITE_OTHER): Payer: BLUE CROSS/BLUE SHIELD | Admitting: Cardiology

## 2017-01-14 ENCOUNTER — Encounter: Payer: Self-pay | Admitting: Cardiology

## 2017-01-14 VITALS — BP 110/78 | HR 70 | Ht 68.0 in | Wt 204.0 lb

## 2017-01-14 DIAGNOSIS — I2510 Atherosclerotic heart disease of native coronary artery without angina pectoris: Secondary | ICD-10-CM

## 2017-01-14 DIAGNOSIS — Z720 Tobacco use: Secondary | ICD-10-CM | POA: Diagnosis not present

## 2017-01-14 DIAGNOSIS — E785 Hyperlipidemia, unspecified: Secondary | ICD-10-CM

## 2017-01-14 LAB — BASIC METABOLIC PANEL
BUN / CREAT RATIO: 12 (ref 9–20)
BUN: 15 mg/dL (ref 6–24)
CALCIUM: 9.2 mg/dL (ref 8.7–10.2)
CO2: 24 mmol/L (ref 20–29)
Chloride: 101 mmol/L (ref 96–106)
Creatinine, Ser: 1.24 mg/dL (ref 0.76–1.27)
GFR, EST AFRICAN AMERICAN: 76 mL/min/{1.73_m2} (ref >59)
GFR, EST NON AFRICAN AMERICAN: 66 mL/min/{1.73_m2} (ref >59)
Glucose: 98 mg/dL (ref 65–99)
POTASSIUM: 4.9 mmol/L (ref 3.5–5.2)
SODIUM: 141 mmol/L (ref 134–144)

## 2017-01-14 LAB — LIPID PANEL
CHOL/HDL RATIO: 3.1 ratio (ref 0.0–5.0)
Cholesterol, Total: 120 mg/dL (ref 100–199)
HDL: 39 mg/dL — AB (ref >39)
LDL Calculated: 57 mg/dL (ref 0–99)
Triglycerides: 120 mg/dL (ref 0–149)
VLDL Cholesterol Cal: 24 mg/dL (ref 5–40)

## 2017-01-14 LAB — HEPATIC FUNCTION PANEL
ALT: 50 IU/L — ABNORMAL HIGH (ref 0–44)
AST: 30 IU/L (ref 0–40)
Albumin: 4.3 g/dL (ref 3.5–5.5)
Alkaline Phosphatase: 102 IU/L (ref 39–117)
BILIRUBIN, DIRECT: 0.18 mg/dL (ref 0.00–0.40)
Bilirubin Total: 0.8 mg/dL (ref 0.0–1.2)
TOTAL PROTEIN: 6.6 g/dL (ref 6.0–8.5)

## 2017-01-14 NOTE — Patient Instructions (Signed)
Continue your current therapy  We will check blood work today  We will continue Brilinta for one year from your heart attack

## 2017-11-28 IMAGING — CR DG CHEST 1V PORT
2 series · 2 of 2 positions shown · non-contrast
Comparison: None.

CLINICAL DATA: ST-elevation myocardial infarction, acute onset.
Initial encounter.

EXAM:
PORTABLE CHEST 1 VIEW

[AP (1 of 2)]
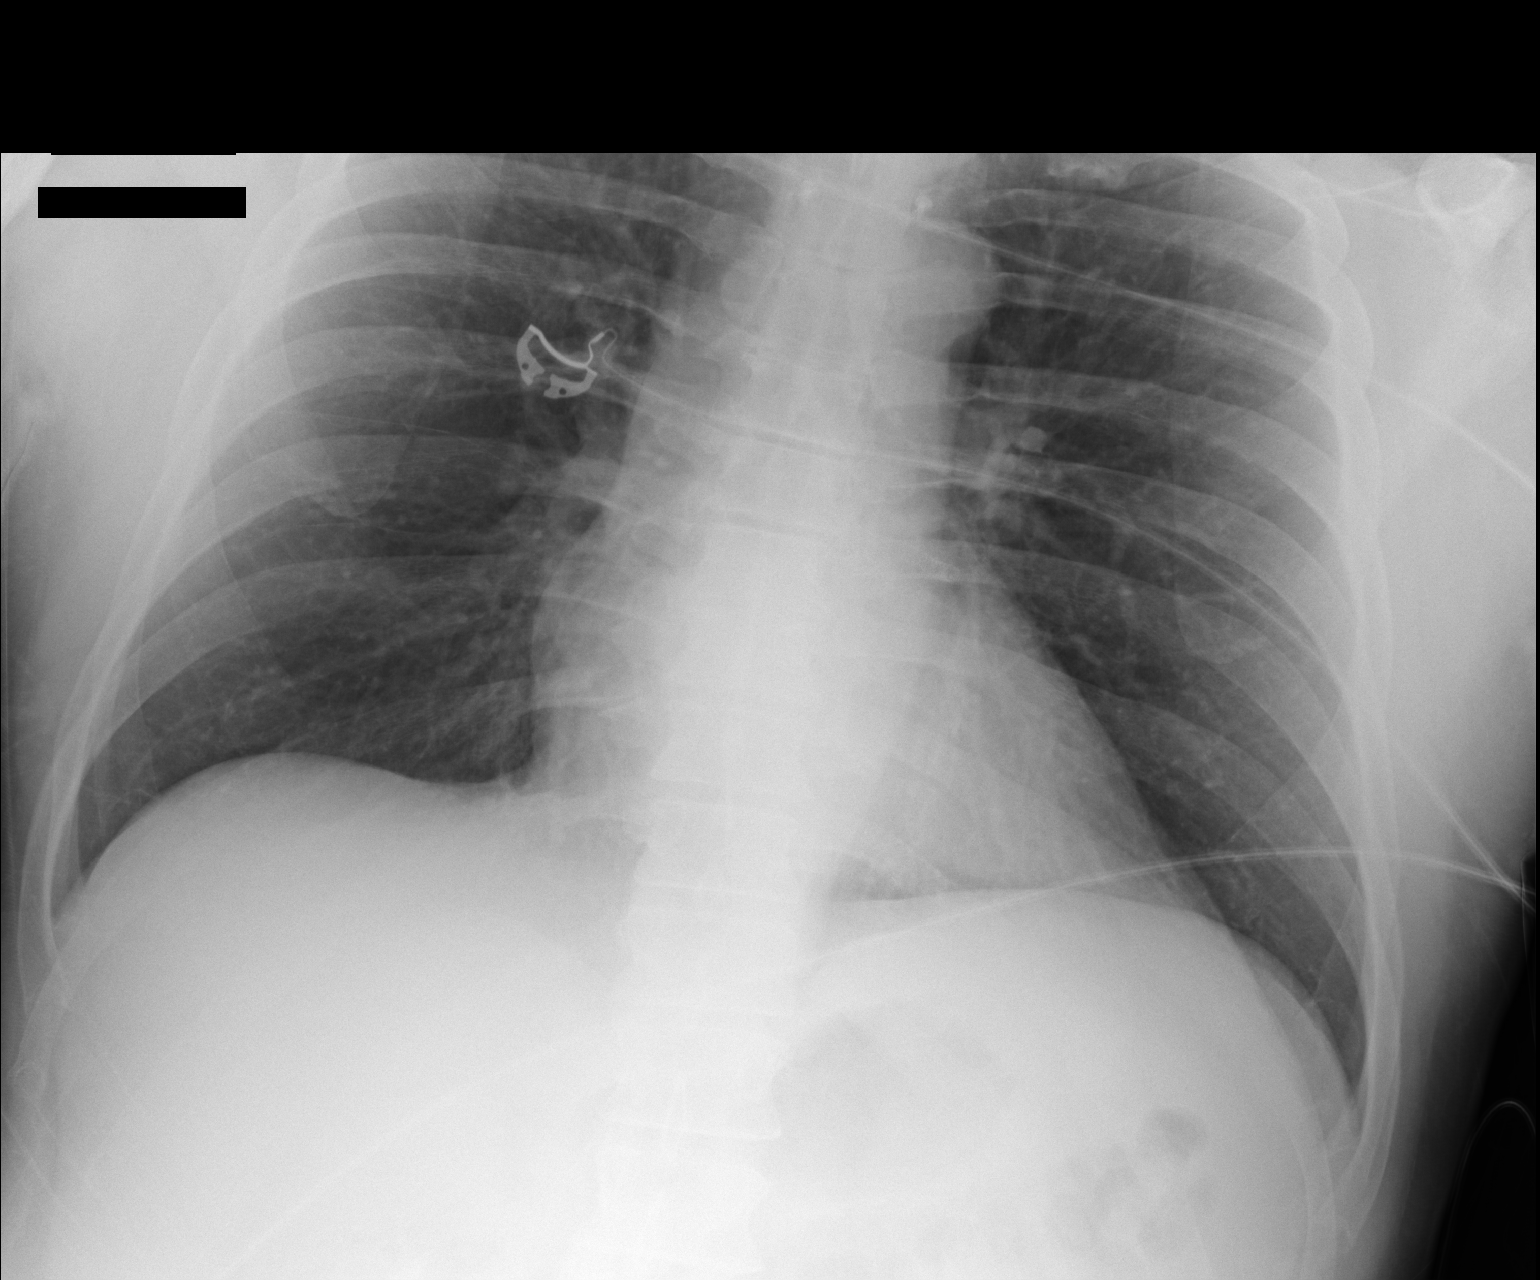

[AP (2 of 2)]
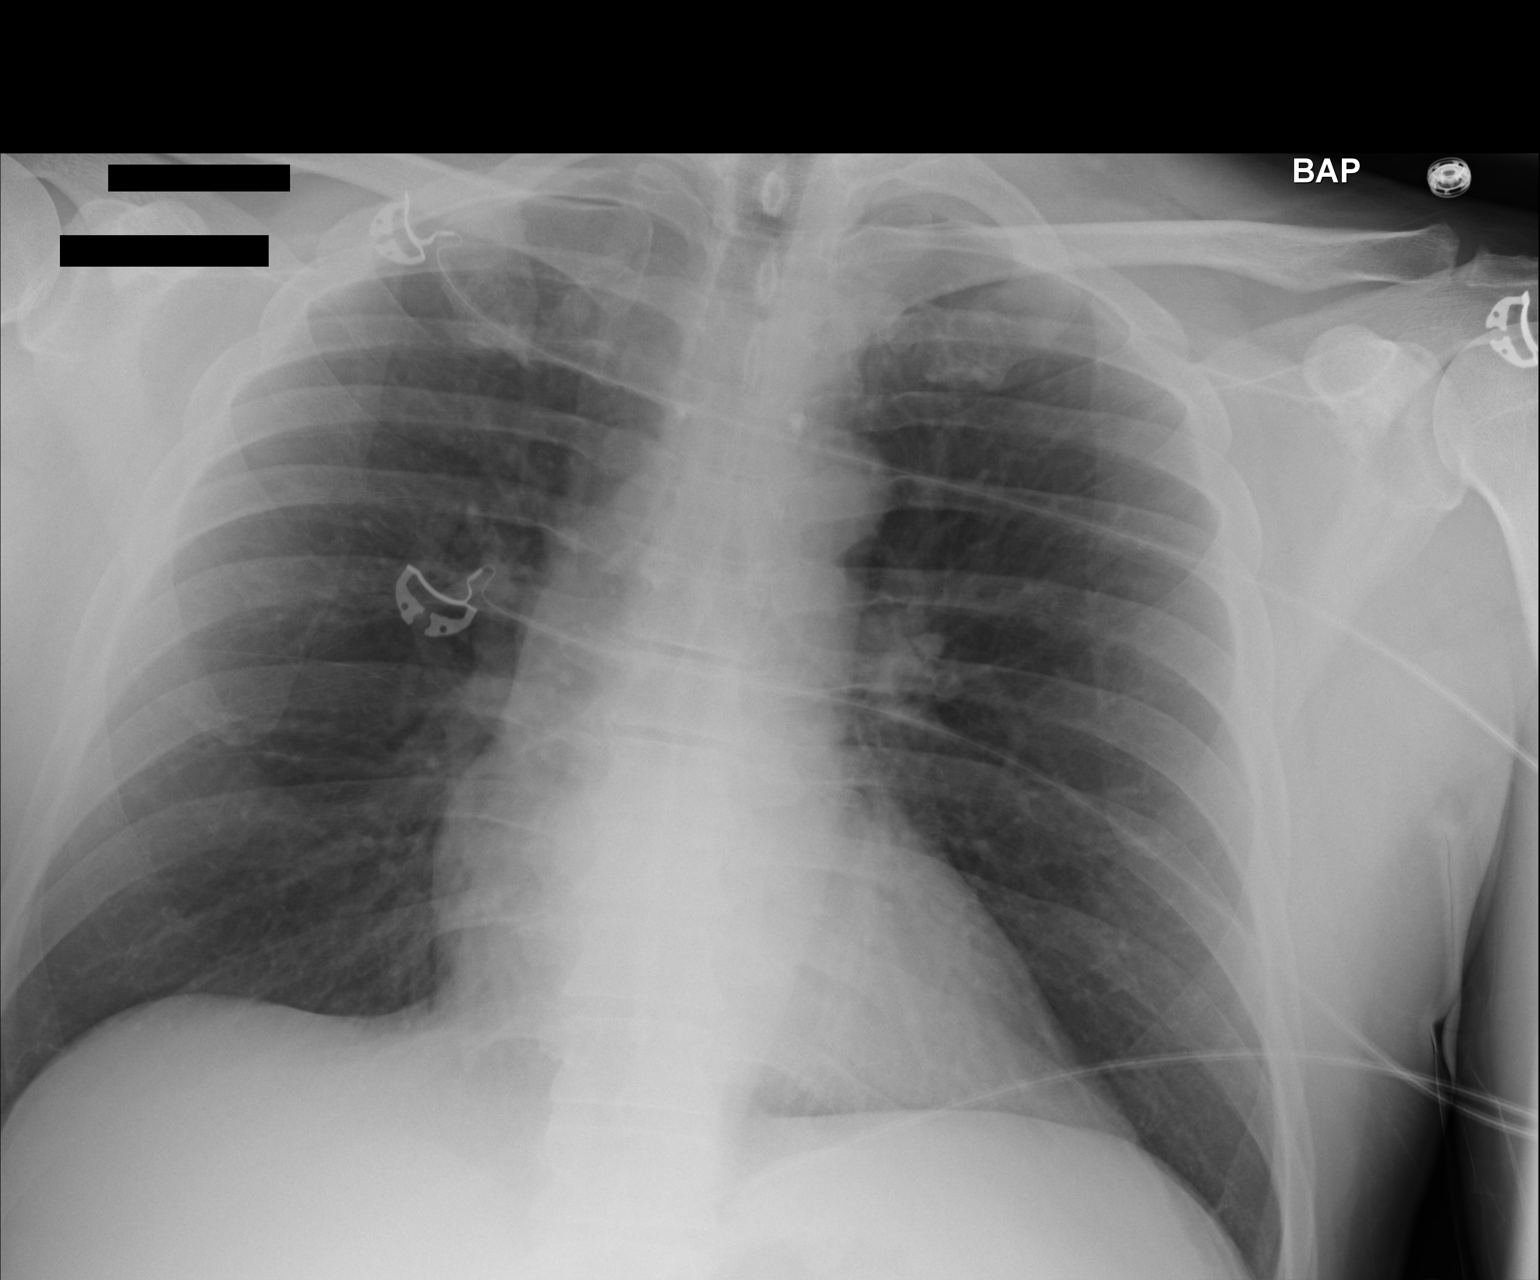

[2 of 2 positions shown; findings below may reference images not displayed]

FINDINGS: The lungs are well-aerated and clear. There is no evidence of focal
opacification, pleural effusion or pneumothorax.

The cardiomediastinal silhouette is within normal limits. No acute
osseous abnormalities are seen.
IMPRESSION: No acute cardiopulmonary process seen.

## 2017-12-19 ENCOUNTER — Other Ambulatory Visit: Payer: Self-pay

## 2023-12-15 ENCOUNTER — Emergency Department (HOSPITAL_COMMUNITY)

## 2023-12-15 ENCOUNTER — Encounter (HOSPITAL_COMMUNITY): Payer: Self-pay

## 2023-12-15 ENCOUNTER — Emergency Department (HOSPITAL_COMMUNITY)
Admission: EM | Admit: 2023-12-15 | Discharge: 2023-12-15 | Discharge status: Against Medical Advice | Attending: Emergency Medicine | Admitting: Emergency Medicine

## 2023-12-15 ENCOUNTER — Other Ambulatory Visit: Payer: Self-pay

## 2023-12-15 DIAGNOSIS — I10 Essential (primary) hypertension: Secondary | ICD-10-CM | POA: Insufficient documentation

## 2023-12-15 DIAGNOSIS — M79605 Pain in left leg: Secondary | ICD-10-CM | POA: Insufficient documentation

## 2023-12-15 DIAGNOSIS — Z5321 Procedure and treatment not carried out due to patient leaving prior to being seen by health care provider: Secondary | ICD-10-CM | POA: Insufficient documentation

## 2023-12-15 DIAGNOSIS — R42 Dizziness and giddiness: Secondary | ICD-10-CM | POA: Diagnosis present

## 2023-12-15 LAB — BASIC METABOLIC PANEL WITH GFR
Anion gap: 13 (ref 5–15)
BUN: 19 mg/dL (ref 6–20)
CO2: 21 mmol/L — ABNORMAL LOW (ref 22–32)
Calcium: 9.3 mg/dL (ref 8.9–10.3)
Chloride: 106 mmol/L (ref 98–111)
Creatinine, Ser: 1.14 mg/dL (ref 0.61–1.24)
GFR, Estimated: >60 mL/min (ref >60)
Glucose, Bld: 88 mg/dL (ref 70–99)
Potassium: 4.1 mmol/L (ref 3.5–5.1)
Sodium: 140 mmol/L (ref 135–145)

## 2023-12-15 LAB — CBC
HCT: 49.5 % (ref 39.0–52.0)
Hemoglobin: 16.6 g/dL (ref 13.0–17.0)
MCH: 30 pg (ref 26.0–34.0)
MCHC: 33.5 g/dL (ref 30.0–36.0)
MCV: 89.5 fL (ref 80.0–100.0)
Platelets: 273 10*3/uL (ref 150–400)
RBC: 5.53 MIL/uL (ref 4.22–5.81)
RDW: 13.4 % (ref 11.5–15.5)
WBC: 11.1 10*3/uL — ABNORMAL HIGH (ref 4.0–10.5)
nRBC: 0 % (ref 0.0–0.2)

## 2023-12-15 LAB — TROPONIN I (HIGH SENSITIVITY): Troponin I (High Sensitivity): 7 ng/L (ref <18)

## 2023-12-15 NOTE — ED Triage Notes (Addendum)
 Pt reports he was sent from Urgent Care to rule out PE/DVTs. He went to [facility] for pain to his left inner thigh. He told them he has been having dizziness and nausea. He does also report mild chest pain but denies shortness of breath. He also is here for high blood pressures that UC was concerned about.

## 2023-12-15 NOTE — ED Notes (Signed)
 Called pt x3 for 2nd trop, no answer.  Per registration pt left AMA.  KM

## 2023-12-15 NOTE — ED Provider Triage Note (Signed)
 Emergency Medicine Provider Triage Evaluation Note  Travis Levine , a 58 y.o. male  was evaluated in triage.  Pt complains of left inner thigh pain, dizziness, nausea.  Sent from urgent care to rule out blood clot or other cardiac etiology given his poorly controlled hypertension as well.  Review of Systems  Positive: Near syncope, leg pain Negative:   Physical Exam  BP (!) 148/103 (BP Location: Left Arm)   Pulse 87   Temp 98.8 F (37.1 C)   Resp 19   Ht 5' 8.5" (1.74 m)   Wt 95.7 kg   SpO2 97%   BMI 31.62 kg/m  Gen:   Awake, no distress   Resp:  Normal effort  MSK:   Moves extremities without difficulty  Other:    Medical Decision Making  Medically screening exam initiated at 5:54 PM.  Appropriate orders placed.  Travis Levine was informed that the remainder of the evaluation will be completed by another provider, this initial triage assessment does not replace that evaluation, and the importance of remaining in the ED until their evaluation is complete.  Workup initiated in triage Sent from urgent care for ultrasound left lower extremity, near syncope, possible blood clot workup   Nelly Banco, PA-C 12/15/23 1754

## 2023-12-16 ENCOUNTER — Emergency Department (HOSPITAL_BASED_OUTPATIENT_CLINIC_OR_DEPARTMENT_OTHER)

## 2023-12-16 ENCOUNTER — Emergency Department (HOSPITAL_BASED_OUTPATIENT_CLINIC_OR_DEPARTMENT_OTHER): Admitting: Radiology

## 2023-12-16 ENCOUNTER — Other Ambulatory Visit: Payer: Self-pay

## 2023-12-16 ENCOUNTER — Emergency Department (HOSPITAL_BASED_OUTPATIENT_CLINIC_OR_DEPARTMENT_OTHER)
Admission: EM | Admit: 2023-12-16 | Discharge: 2023-12-16 | Disposition: A | Discharge status: Home / Self Care | Attending: Emergency Medicine | Admitting: Emergency Medicine

## 2023-12-16 DIAGNOSIS — I10 Essential (primary) hypertension: Secondary | ICD-10-CM | POA: Insufficient documentation

## 2023-12-16 DIAGNOSIS — Z79899 Other long term (current) drug therapy: Secondary | ICD-10-CM | POA: Insufficient documentation

## 2023-12-16 DIAGNOSIS — I2510 Atherosclerotic heart disease of native coronary artery without angina pectoris: Secondary | ICD-10-CM | POA: Insufficient documentation

## 2023-12-16 DIAGNOSIS — R030 Elevated blood-pressure reading, without diagnosis of hypertension: Secondary | ICD-10-CM

## 2023-12-16 DIAGNOSIS — I252 Old myocardial infarction: Secondary | ICD-10-CM | POA: Diagnosis not present

## 2023-12-16 DIAGNOSIS — Z7982 Long term (current) use of aspirin: Secondary | ICD-10-CM | POA: Insufficient documentation

## 2023-12-16 DIAGNOSIS — F17200 Nicotine dependence, unspecified, uncomplicated: Secondary | ICD-10-CM | POA: Diagnosis not present

## 2023-12-16 DIAGNOSIS — M79652 Pain in left thigh: Secondary | ICD-10-CM | POA: Diagnosis present

## 2023-12-16 LAB — CBC
HCT: 49.6 % (ref 39.0–52.0)
Hemoglobin: 16.8 g/dL (ref 13.0–17.0)
MCH: 30.2 pg (ref 26.0–34.0)
MCHC: 33.9 g/dL (ref 30.0–36.0)
MCV: 89 fL (ref 80.0–100.0)
Platelets: 263 10*3/uL (ref 150–400)
RBC: 5.57 MIL/uL (ref 4.22–5.81)
RDW: 13.7 % (ref 11.5–15.5)
WBC: 10.2 10*3/uL (ref 4.0–10.5)
nRBC: 0 % (ref 0.0–0.2)

## 2023-12-16 LAB — BASIC METABOLIC PANEL WITH GFR
Anion gap: 16 — ABNORMAL HIGH (ref 5–15)
BUN: 18 mg/dL (ref 6–20)
CO2: 22 mmol/L (ref 22–32)
Calcium: 10 mg/dL (ref 8.9–10.3)
Chloride: 101 mmol/L (ref 98–111)
Creatinine, Ser: 1.25 mg/dL — ABNORMAL HIGH (ref 0.61–1.24)
GFR, Estimated: >60 mL/min (ref >60)
Glucose, Bld: 157 mg/dL — ABNORMAL HIGH (ref 70–99)
Potassium: 3.8 mmol/L (ref 3.5–5.1)
Sodium: 139 mmol/L (ref 135–145)

## 2023-12-16 LAB — TROPONIN T, HIGH SENSITIVITY: Troponin T High Sensitivity: <15 ng/L (ref <19)

## 2023-12-16 MED ORDER — AMLODIPINE BESYLATE 5 MG PO TABS
5.0000 mg | ORAL_TABLET | Freq: Once | ORAL | Status: DC
  Filled 2023-12-16: qty 1

## 2023-12-16 NOTE — ED Triage Notes (Signed)
 C/o upper left leg pain and swelling x 1 week. States area is tender to touch and sensitive in area. Also endorses HTN.   Seen at [facility] ED last night for same bu LWBS.

## 2023-12-16 NOTE — ED Provider Notes (Signed)
 [place] EMERGENCY DEPARTMENT AT [facility] Provider Note   CSN: [id number] Arrival date & time: 12/16/23  1124     History  Chief Complaint  Patient presents with   Leg Pain   Hypertension    Travis Levine is a 58 y.o. male.  Patient with past medical history of tobacco use, coronary artery disease, hyperlipidemia, history of STEMI presenting to emergency room with complaint of left upper thigh pain and swelling for 1 week.  Patient reports that he was seen at urgent care yesterday for this and found to have elevated blood pressure so he was sent to ER for elevated blood pressure and rule out of DVT.  Patient reports that he does not take any medications.  He no longer follows with cardiology.  He denies chest pain shortness of breath headache.  Denies any history of DVT or PE.  No recent surgery or prolonged immobilization.  Reports he travels frequently with work, mostly recently flying short distance to [place]. Denies injury, trauma or fall.     Leg Pain Hypertension       Home Medications Prior to Admission medications   Medication Sig Start Date End Date Taking? Authorizing Provider  acetaminophen  (TYLENOL ) 325 MG tablet Take 2 tablets (650 mg total) by mouth every 4 (four) hours as needed for headache or mild pain. 10/13/16   Abel Hoe, PA-C  aspirin  81 MG chewable tablet Chew 1 tablet (81 mg total) by mouth daily. 10/14/16   Kilroy, Luke K, PA-C  atorvastatin  (LIPITOR ) 80 MG tablet Take 1 tablet (80 mg total) by mouth daily at 6 PM. 10/13/16   Kilroy, Luke K, PA-C  metoprolol  (LOPRESSOR ) 50 MG tablet Take 1 tablet (50 mg total) by mouth 2 (two) times daily. 10/13/16   Kilroy, Luke K, PA-C  nitroGLYCERIN  (NITROSTAT ) 0.4 MG SL tablet Place 1 tablet (0.4 mg total) under the tongue every 5 (five) minutes x 3 doses as needed for chest pain. 10/13/16   Abel Hoe, PA-C  ticagrelor  (BRILINTA ) 90 MG TABS tablet Take 1 tablet (90 mg total) by mouth 2 (two) times daily.  10/13/16   Abel Hoe, PA-C      Allergies    Sulfa antibiotics    Review of Systems   Review of Systems  Cardiovascular:  Positive for leg swelling.    Physical Exam Updated Vital Signs BP (!) 154/108 (BP Location: Right Arm)   Pulse 95   Temp 97.7 F (36.5 C)   Resp 17   SpO2 100%  Physical Exam Vitals and nursing note reviewed.  Constitutional:      General: He is not in acute distress.    Appearance: He is not toxic-appearing.  HENT:     Head: Normocephalic and atraumatic.  Eyes:     General: No scleral icterus.    Conjunctiva/sclera: Conjunctivae normal.  Cardiovascular:     Rate and Rhythm: Normal rate and regular rhythm.     Pulses: Normal pulses.     Heart sounds: Normal heart sounds.  Pulmonary:     Effort: Pulmonary effort is normal. No respiratory distress.     Breath sounds: Normal breath sounds.  Abdominal:     General: Abdomen is flat. Bowel sounds are normal.     Palpations: Abdomen is soft.     Tenderness: There is no abdominal tenderness.  Musculoskeletal:     Right lower leg: No edema.     Left lower leg: No edema.  Comments: No obvious swelling, discoloration to right upper thigh. TTP over medial upper thigh. No calf tenderness or swelling.   Skin:    General: Skin is warm and dry.     Findings: No lesion.  Neurological:     General: No focal deficit present.     Mental Status: He is alert and oriented to person, place, and time. Mental status is at baseline.     ED Results / Procedures / Treatments   Labs (all labs ordered are listed, but only abnormal results are displayed) Labs Reviewed  BASIC METABOLIC PANEL WITH GFR - Abnormal; Notable for the following components:      Result Value   Glucose, Bld 157 (*)    Creatinine, Ser 1.25 (*)    Anion gap 16 (*)    All other components within normal limits  CBC  TROPONIN T, HIGH SENSITIVITY    EKG None  Radiology US  Venous Img Lower Unilateral Left Result Date:  12/16/2023 CLINICAL DATA:  Left lower extremity pain EXAM: LEFT LOWER EXTREMITY VENOUS DOPPLER ULTRASOUND TECHNIQUE: Gray-scale sonography with compression, as well as color and duplex ultrasound, were performed to evaluate the deep venous system(s) from the level of the common femoral vein through the popliteal and proximal calf veins. COMPARISON:  None Available. FINDINGS: VENOUS Normal compressibility of the common femoral, superficial femoral, and popliteal veins, as well as the visualized calf veins. Visualized portions of profunda femoral vein and great saphenous vein unremarkable. No filling defects to suggest DVT on grayscale or color Doppler imaging. Doppler waveforms show normal direction of venous flow, normal respiratory plasticity and response to augmentation. Limited views of the contralateral common femoral vein are unremarkable. OTHER None. Limitations: none IMPRESSION: Negative. Electronically Signed   By: Fernando Hoyer M.D.   On: 12/16/2023 16:32   DG Hip Unilat W or Wo Pelvis 2-3 Views Left Result Date: 12/16/2023 CLINICAL DATA:  Left hip pain for 1 week without reported injury. EXAM: DG HIP (WITH OR WITHOUT PELVIS) 2-3V LEFT COMPARISON:  None Available. FINDINGS: There is no evidence of hip fracture or dislocation. There is no evidence of arthropathy or other focal bone abnormality. IMPRESSION: Negative. Electronically Signed   By: Rosalene Colon M.D.   On: 12/16/2023 15:01   DG Chest 2 View Result Date: 12/15/2023 CLINICAL DATA:  near syncope EXAM: CHEST - 2 VIEW COMPARISON:  10/10/2016. FINDINGS: Cardiac silhouette is unremarkable. No pneumothorax or pleural effusion. The lungs are clear. The visualized skeletal structures are unremarkable. IMPRESSION: No acute cardiopulmonary process. Electronically Signed   By: Sydell Eva M.D.   On: 12/15/2023 19:08    Procedures Procedures    Medications Ordered in ED Medications  amLODipine  (NORVASC ) tablet 5 mg (has no administration  in time range)    ED Course/ Medical Decision Making/ A&P                                 Medical Decision Making Amount and/or Complexity of Data Reviewed Labs: ordered. Radiology: ordered.  Risk Prescription drug management.   This patient presents to the ED for concern of left leg pain, this involves an extensive number of treatment options, and is a complaint that carries with it a high risk of complications and morbidity.  The differential diagnosis includes MSK pain, septic joint, fracture, DVT   Co morbidities that complicate the patient evaluation  Hx of STEMI HTN, HLD   Additional  history obtained:  Additional history obtained from patient came with urgent care note from 12/15/2023 in which he was sent here for elevated blood pressure reading -patient denies symptoms.  Also reports send for left leg pain and DVT study. Reviewed chest x-ray from yesterday without acute findings.    Lab Tests:  I personally interpreted labs.  The pertinent results include:   CBC without leukocytosis no anemia.  BMP without electrolyte abnormality.  Creatinine is 1.25 which is similar to prior recent labs. Troponin wnl.    Imaging Studies ordered:  I ordered imaging studies including left hip x-ray, DVT study  I independently visualized and interpreted imaging which showed x-ray is negative, DVT:  negative  I agree with the radiologist interpretation   Cardiac Monitoring: / EKG:  The patient was maintained on a cardiac monitor.   Problem List / ED Course / Critical interventions / Medication management  Patient reporting to ER with complaint of left thigh pain for 1 week. Notes swelling around the area as well. No injury, trauma or fall. Worse with walking and ROM. He has strong dorsal pedal pulse, good cap refill. No overlaying rash. Compartments are soft. Reports he has also had elevated BP, no symptoms associated with this. No chest pain, shortness of breath. No headache and  no focal deficits. Given no HA and no CP do no think imaging is needed at this time. Will check basic labs to rule out end organ damage. Will eval left thigh pain with x-ray and DVT study.   I ordered medication including Amlodipine  for elevated BP read without evidence of end organ damage while awaiting imaging results to rule out DVT. On BP recheck, BP 120/90 - will hold off on amlodipine .  Overall reassuring workup. Will have patient follow up with cardiology, as he reports he is lost to follow up and is not taking any of his medications. Will place referral. No DVT on US  and normal x-ray. Feel this is appropriate to follow up with PCP.  Reevaluation of the patient after these medicines showed that the patient improved I have reviewed the patients home medicines and have made adjustments as needed   Plan  F/u w/ PCP in 2-3d to ensure resolution of sx.  Patient was given return precautions. Patient stable for discharge at this time.  Patient educated on sx/dx and verbalized understanding of plan. Return to ER w/ new or worsening sx.          Final Clinical Impression(s) / ED Diagnoses Final diagnoses:  Left thigh pain  History of ST elevation myocardial infarction (STEMI)  Elevated blood pressure reading    Rx / DC Orders ED Discharge Orders          Ordered    Ambulatory referral to Cardiology       Comments: If you have not heard from the Cardiology office within the next 72 hours please call [phone].   12/16/23 1708              Alaysiah Browder, Kandace Organ, PA-C 12/16/23 1714    Deatra Face, MD 12/23/23 [phone]

## 2023-12-16 NOTE — Discharge Instructions (Addendum)
 Please follow-up with primary care.  You can use ice and heat over area of pain you can also use Voltaren gel or lidocaine  patch.  Alternate Tylenol  and ibuprofen.  I have placed referral to cardiology, please follow up given your history of heart attack in the pass and history of syncopal episodes. They should be able to restart medications and monitor blood pressure as well.
